# Patient Record
Sex: Male | Born: 1970 | Hispanic: No | Marital: Married | Smoking: Former smoker
Health system: Southern US, Community
[De-identification: ages and names within clinical notes are randomized; demographics above are authoritative.]

---

## 2012-02-07 ENCOUNTER — Other Ambulatory Visit: Payer: Self-pay | Admitting: Infectious Diseases

## 2012-02-07 ENCOUNTER — Ambulatory Visit
Admission: RE | Admit: 2012-02-07 | Discharge: 2012-02-07 | Disposition: A | Discharge status: Home / Self Care | Payer: No Typology Code available for payment source | Source: Ambulatory Visit | Attending: Infectious Diseases | Admitting: Infectious Diseases

## 2012-02-07 DIAGNOSIS — R7611 Nonspecific reaction to tuberculin skin test without active tuberculosis: Secondary | ICD-10-CM

## 2019-05-13 ENCOUNTER — Inpatient Hospital Stay (HOSPITAL_COMMUNITY)
Admission: EM | Admit: 2019-05-13 | Discharge: 2019-05-15 | DRG: 868 | Disposition: A | Discharge status: Home / Self Care | Payer: Medicaid Other | Attending: Internal Medicine | Admitting: Internal Medicine

## 2019-05-13 ENCOUNTER — Encounter (HOSPITAL_COMMUNITY): Payer: Self-pay | Admitting: Emergency Medicine

## 2019-05-13 ENCOUNTER — Emergency Department (HOSPITAL_COMMUNITY): Payer: Medicaid Other

## 2019-05-13 ENCOUNTER — Other Ambulatory Visit: Payer: Self-pay

## 2019-05-13 DIAGNOSIS — D696 Thrombocytopenia, unspecified: Secondary | ICD-10-CM | POA: Diagnosis present

## 2019-05-13 DIAGNOSIS — E871 Hypo-osmolality and hyponatremia: Secondary | ICD-10-CM | POA: Diagnosis present

## 2019-05-13 DIAGNOSIS — Z20828 Contact with and (suspected) exposure to other viral communicable diseases: Secondary | ICD-10-CM | POA: Diagnosis present

## 2019-05-13 DIAGNOSIS — D75A Glucose-6-phosphate dehydrogenase (G6PD) deficiency without anemia: Secondary | ICD-10-CM | POA: Diagnosis present

## 2019-05-13 DIAGNOSIS — G4700 Insomnia, unspecified: Secondary | ICD-10-CM | POA: Diagnosis present

## 2019-05-13 DIAGNOSIS — B54 Unspecified malaria: Principal | ICD-10-CM | POA: Diagnosis present

## 2019-05-13 DIAGNOSIS — Z23 Encounter for immunization: Secondary | ICD-10-CM

## 2019-05-13 DIAGNOSIS — D649 Anemia, unspecified: Secondary | ICD-10-CM | POA: Diagnosis present

## 2019-05-13 DIAGNOSIS — R509 Fever, unspecified: Secondary | ICD-10-CM

## 2019-05-13 DIAGNOSIS — Z789 Other specified health status: Secondary | ICD-10-CM

## 2019-05-13 LAB — URINALYSIS, ROUTINE W REFLEX MICROSCOPIC
Glucose, UA: NEGATIVE mg/dL
Hgb urine dipstick: NEGATIVE
Ketones, ur: NEGATIVE mg/dL
Leukocytes,Ua: NEGATIVE
Nitrite: NEGATIVE
Protein, ur: 30 mg/dL — AB
Specific Gravity, Urine: 1.015 (ref 1.005–1.030)
pH: 6 (ref 5.0–8.0)

## 2019-05-13 LAB — CBC WITH DIFFERENTIAL/PLATELET
Abs Immature Granulocytes: 0.14 10*3/uL — ABNORMAL HIGH (ref 0.00–0.07)
Basophils Absolute: 0 10*3/uL (ref 0.0–0.1)
Basophils Relative: 1 %
Eosinophils Absolute: 0 10*3/uL (ref 0.0–0.5)
Eosinophils Relative: 0 %
HCT: 37.7 % — ABNORMAL LOW (ref 39.0–52.0)
Hemoglobin: 12.7 g/dL — ABNORMAL LOW (ref 13.0–17.0)
Immature Granulocytes: 3 %
Lymphocytes Relative: 34 %
Lymphs Abs: 1.8 10*3/uL (ref 0.7–4.0)
MCH: 26 pg (ref 26.0–34.0)
MCHC: 33.7 g/dL (ref 30.0–36.0)
MCV: 77.1 fL — ABNORMAL LOW (ref 80.0–100.0)
Monocytes Absolute: 0.6 10*3/uL (ref 0.1–1.0)
Monocytes Relative: 10 %
Neutro Abs: 2.8 10*3/uL (ref 1.7–7.7)
Neutrophils Relative %: 52 %
Platelets: 67 10*3/uL — ABNORMAL LOW (ref 150–400)
RBC: 4.89 MIL/uL (ref 4.22–5.81)
RDW: 15 % (ref 11.5–15.5)
WBC: 5.4 10*3/uL (ref 4.0–10.5)
nRBC: 0 % (ref 0.0–0.2)

## 2019-05-13 LAB — DIC (DISSEMINATED INTRAVASCULAR COAGULATION)PANEL
D-Dimer, Quant: 1.19 ug/mL-FEU — ABNORMAL HIGH (ref 0.00–0.50)
Fibrinogen: 672 mg/dL — ABNORMAL HIGH (ref 210–475)
INR: 1.2 (ref 0.8–1.2)
Platelets: 57 10*3/uL — ABNORMAL LOW (ref 150–400)
Prothrombin Time: 14.6 seconds (ref 11.4–15.2)
Smear Review: NONE SEEN
aPTT: 36 seconds (ref 24–36)

## 2019-05-13 LAB — COMPREHENSIVE METABOLIC PANEL
ALT: 108 U/L — ABNORMAL HIGH (ref 0–44)
AST: 95 U/L — ABNORMAL HIGH (ref 15–41)
Albumin: 2.1 g/dL — ABNORMAL LOW (ref 3.5–5.0)
Alkaline Phosphatase: 96 U/L (ref 38–126)
Anion gap: 10 (ref 5–15)
BUN: 15 mg/dL (ref 6–20)
CO2: 22 mmol/L (ref 22–32)
Calcium: 7.9 mg/dL — ABNORMAL LOW (ref 8.9–10.3)
Chloride: 93 mmol/L — ABNORMAL LOW (ref 98–111)
Creatinine, Ser: 1.04 mg/dL (ref 0.61–1.24)
GFR calc Af Amer: >60 mL/min (ref >60)
GFR calc non Af Amer: >60 mL/min (ref >60)
Glucose, Bld: 139 mg/dL — ABNORMAL HIGH (ref 70–99)
Potassium: 4.1 mmol/L (ref 3.5–5.1)
Sodium: 125 mmol/L — ABNORMAL LOW (ref 135–145)
Total Bilirubin: 1.1 mg/dL (ref 0.3–1.2)
Total Protein: 7.1 g/dL (ref 6.5–8.1)

## 2019-05-13 LAB — URINALYSIS, MICROSCOPIC (REFLEX)

## 2019-05-13 LAB — SAVE SMEAR(SSMR), FOR PROVIDER SLIDE REVIEW

## 2019-05-13 LAB — PARASITE EXAM SCREEN, BLOOD-W CONF TO LABCORP (NOT @ ARMC)

## 2019-05-13 LAB — SARS CORONAVIRUS 2 (TAT 6-24 HRS): SARS Coronavirus 2: NEGATIVE

## 2019-05-13 LAB — HIV ANTIBODY (ROUTINE TESTING W REFLEX): HIV Screen 4th Generation wRfx: NONREACTIVE

## 2019-05-13 MED ORDER — RAMELTEON 8 MG PO TABS
8.0000 mg | ORAL_TABLET | Freq: Every day | ORAL | Status: DC
  Administered 2019-05-13 – 2019-05-14 (×2): 8 mg via ORAL
  Filled 2019-05-13 (×3): qty 1

## 2019-05-13 MED ORDER — ACETAMINOPHEN 500 MG PO TABS
1000.0000 mg | ORAL_TABLET | Freq: Once | ORAL | Status: AC
  Administered 2019-05-13: 1000 mg via ORAL
  Filled 2019-05-13: qty 2

## 2019-05-13 MED ORDER — ONDANSETRON HCL 4 MG/2ML IJ SOLN: 4.0000 mg | Freq: Four times a day (QID) | INTRAMUSCULAR | Status: DC | PRN

## 2019-05-13 MED ORDER — ARTEMETHER-LUMEFANTRINE 20-120 MG PO TABS
4.0000 | ORAL_TABLET | Freq: Once | ORAL | Status: DC
  Filled 2019-05-13: qty 4

## 2019-05-13 MED ORDER — ARTEMETHER-LUMEFANTRINE 20-120 MG PO TABS
4.0000 | ORAL_TABLET | Freq: Three times a day (TID) | ORAL | Status: AC
  Administered 2019-05-13 (×2): 4 via ORAL
  Filled 2019-05-13 (×2): qty 4

## 2019-05-13 MED ORDER — MORPHINE SULFATE (PF) 4 MG/ML IV SOLN
4.0000 mg | Freq: Once | INTRAVENOUS | Status: AC
  Administered 2019-05-13: 4 mg via INTRAVENOUS
  Filled 2019-05-13: qty 1

## 2019-05-13 MED ORDER — ACETAMINOPHEN 325 MG PO TABS: 650.0000 mg | ORAL_TABLET | Freq: Four times a day (QID) | ORAL | Status: DC | PRN

## 2019-05-13 MED ORDER — SODIUM CHLORIDE 0.9 % IV BOLUS
1000.0000 mL | Freq: Once | INTRAVENOUS | Status: AC
  Administered 2019-05-13: 11:00:00 1000 mL via INTRAVENOUS

## 2019-05-13 MED ORDER — ACETAMINOPHEN 325 MG PO TABS
650.0000 mg | ORAL_TABLET | Freq: Four times a day (QID) | ORAL | Status: DC
  Administered 2019-05-13 – 2019-05-14 (×4): 650 mg via ORAL
  Filled 2019-05-13 (×4): qty 2

## 2019-05-13 MED ORDER — IBUPROFEN 800 MG PO TABS
800.0000 mg | ORAL_TABLET | Freq: Once | ORAL | Status: AC
  Administered 2019-05-13: 800 mg via ORAL
  Filled 2019-05-13: qty 1

## 2019-05-13 MED ORDER — ARTEMETHER-LUMEFANTRINE 20-120 MG PO TABS
4.0000 | ORAL_TABLET | Freq: Two times a day (BID) | ORAL | Status: DC
  Administered 2019-05-14 – 2019-05-15 (×3): 4 via ORAL
  Filled 2019-05-13 (×4): qty 4

## 2019-05-13 MED ORDER — ONDANSETRON HCL 4 MG/2ML IJ SOLN
4.0000 mg | Freq: Once | INTRAMUSCULAR | Status: AC
  Administered 2019-05-13: 4 mg via INTRAVENOUS
  Filled 2019-05-13: qty 2

## 2019-05-13 MED ORDER — ACETAMINOPHEN 325 MG PO TABS
650.0000 mg | ORAL_TABLET | Freq: Once | ORAL | Status: AC
  Administered 2019-05-13: 05:00:00 650 mg via ORAL
  Filled 2019-05-13: qty 2

## 2019-05-13 NOTE — ED Provider Notes (Signed)
[facility] EMERGENCY DEPARTMENT Provider Note   CSN: [id number] Arrival date & time: 05/13/19  0359     History   Chief Complaint Chief Complaint  Patient presents with  . Insomnia    HPI Mason Dunn is a 48 y.o. male.     Pt presents to the ED today with back pain and fever.  He has not been able to sleep.  Pt has just arrived from [place] to the [place] on 10[place]19.  The pt flew.  He was not aware he had a fever.  He was given tylenol at 0500.  Pt denies any n/v.  Pt speaks the [place] dialect of Arabic.  Information obtained via interpreter.     History reviewed. No pertinent past medical history.  Patient Active Problem List   Diagnosis Date Noted  . 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 No pertinent surgical history.   No surg hx   Home Medications    Prior to Admission medications   Medication Sig Start Date End Date Taking? Authorizing Provider  doxylamine, Sleep, (UNISOM) 25 MG tablet Take 25 mg by mouth at bedtime as needed for sleep.   Yes [provider]  Multiple Vitamin (MULTIVITAMIN WITH MINERALS) TABS tablet Take 1 tablet by mouth daily.   Yes [provider]  no meds  Family History No family history on file.  Social History Social History   Tobacco Use  . Smoking status: Never Smoker  . Smokeless tobacco: Never Used  Substance Use Topics  . Alcohol use: Never    Frequency: Never  . Drug use: Never     Allergies   Patient has no known allergies.   Review of Systems Review of Systems  Constitutional: Positive for fever.  Musculoskeletal: Positive for back pain.  All other systems reviewed and are negative.    Physical Exam Updated Vital Signs BP 105/80 (BP Location: Right Arm)   Pulse 87   Temp 98.7 F (37.1 C) (Oral)   Resp (!) 26   Ht 5\' 8"  (1.727 m)   Wt 90.7 kg   SpO2 95%   BMI 30.41 kg/m   Physical Exam Vitals signs and nursing note reviewed.   Constitutional:      Appearance: Normal appearance.  HENT:     Head: Normocephalic and atraumatic.     Right Ear: External ear normal.     Left Ear: External ear normal.     Nose: Nose normal.     Mouth/Throat:     Mouth: Mucous membranes are dry.  Eyes:     Extraocular Movements: Extraocular movements intact.     Conjunctiva/sclera: Conjunctivae normal.     Pupils: Pupils are equal, round, and reactive to light.  Neck:     Musculoskeletal: Normal range of motion and neck supple.  Cardiovascular:     Rate and Rhythm: Normal rate and regular rhythm.     Pulses: Normal pulses.     Heart sounds: Normal heart sounds.  Pulmonary:     Effort: Pulmonary effort is normal.     Breath sounds: Normal breath sounds.  Abdominal:     General: Abdomen is flat. Bowel sounds are normal.     Palpations: Abdomen is soft.  Musculoskeletal: Normal range of motion.  Skin:    General: Skin is warm.     Capillary Refill: Capillary refill takes less than 2 seconds.  Neurological:     General: No focal deficit present.  Mental Status: He is alert and oriented to person, place, and time.  Psychiatric:        Mood and Affect: Mood normal.        Behavior: Behavior normal.        Thought Content: Thought content normal.        Judgment: Judgment normal.      ED Treatments / Results  Labs (all labs ordered are listed, but only abnormal results are displayed) Labs Reviewed  CBC WITH DIFFERENTIAL/PLATELET - Abnormal; Notable for the following components:      Result Value   Hemoglobin 12.7 (*)    HCT 37.7 (*)    MCV 77.1 (*)    Platelets 67 (*)    Abs Immature Granulocytes 0.14 (*)    All other components within normal limits  COMPREHENSIVE METABOLIC PANEL - Abnormal; Notable for the following components:   Sodium 125 (*)    Chloride 93 (*)    Glucose, Bld 139 (*)    Calcium 7.9 (*)    Albumin 2.1 (*)    AST 95 (*)    ALT 108 (*)    All other components within normal limits   URINALYSIS, ROUTINE W REFLEX MICROSCOPIC - Abnormal; Notable for the following components:   Bilirubin Urine MODERATE (*)    Protein, ur 30 (*)    All other components within normal limits  URINALYSIS, MICROSCOPIC (REFLEX) - Abnormal; Notable for the following components:   Bacteria, UA FEW (*)    All other components within normal limits  SARS CORONAVIRUS 2 (TAT 6-24 HRS)  PARASITE EXAM SCREEN, BLOOD-W CONF TO [facility] (NOT @ [facility])  PARASITE EXAM, BLOOD  HIV ANTIBODY (ROUTINE TESTING W REFLEX)    EKG EKG Interpretation  Date/Time:  Tuesday May 13 2019 09:41:44 EDT Ventricular Rate:  94 PR Interval:    QRS Duration: 92 QT Interval:  340 QTC Calculation: 426 R Axis:   54 Text Interpretation: Sinus rhythm Nonspecific repol abnormality, inferior leads Borderline ST elevation, lateral leads No old tracing to compare Confirmed by Jacalyn LefevreHaviland, Yailine Ballard [phone]([id number]) on 05/13/2019 9:45:28 AM   Radiology Dg Chest Portable 1 View  Result Date: 05/13/2019 CLINICAL DATA:  Lack of sleep for 1 week, insomnia. EXAM: PORTABLE CHEST 1 VIEW COMPARISON:  02/07/2012 FINDINGS: The heart size and mediastinal contours are within normal limits. Both lungs are clear. The visualized skeletal structures are unremarkable. IMPRESSION: No active disease. Electronically Signed   By: Donzetta KohutGeoffrey  Wile M.D.   On: 05/13/2019 10:14    Procedures Procedures (including critical care time)  Medications Ordered in ED Medications  artemether-lumefantrine (COARTEM) 20-120 MG tablet 4 tablet (has no administration in time range)  artemether-lumefantrine (COARTEM) 20-120 MG tablet 4 tablet (has no administration in time range)    Followed by  artemether-lumefantrine (COARTEM) 20-120 MG tablet 4 tablet (has no administration in time range)  acetaminophen (TYLENOL) tablet 650 mg (650 mg Oral Given 05/13/19 0512)  sodium chloride 0.9 % bolus 1,000 mL (1,000 mLs Intravenous New Bag/Given 05/13/19 1032)  morphine 4 MG/ML  injection 4 mg (4 mg Intravenous Given 05/13/19 1037)  ondansetron (ZOFRAN) injection 4 mg (4 mg Intravenous Given 05/13/19 1035)  acetaminophen (TYLENOL) tablet 1,000 mg (1,000 mg Oral Given 05/13/19 1032)  ibuprofen (ADVIL) tablet 800 mg (800 mg Oral Given 05/13/19 1033)     Initial Impression / Assessment and Plan / ED Course  I have reviewed the triage vital signs and the nursing notes.  Pertinent labs & imaging results that  were available during my care of the patient were reviewed by me and considered in my medical decision making (see chart for details).  Clinical Course as of May 12 1049  Tue May 13, 2019  0923 NEUT#: 2.8 [JR]    Clinical Course User Index [JR] Robinson, [place] N, PA-C   Pt looks to have Plasmodium on thin smear.  I asked him if he's ever had Malaria and he said he has had it in the past.  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  Pt d/w IMTS for admission.  Final Clinical Impressions(s) / ED Diagnoses   Final diagnoses:  Hyponatremia  Malaria  Anemia, unspecified type  Fever in adult  Language barrier affecting health care    ED Discharge Orders    None       Isla Pence, MD 05/13/19 1050

## 2019-05-13 NOTE — H&P (Signed)
Date: 05/13/2019               Patient Name:  Mason Dunn MRN: [id number]  DOB: 03-28-1971 Age / Sex: 48 y.o., male   PCP: Patient, No Pcp Per         Medical Service: Internal Medicine Teaching Service         Attending Physician: Dr. Aldine Contes, MD    First Contact: Dr. Gilford Rile Pager: [phone]  Second Contact: Dr. Myrtie Hawk Pager: [phone]       After Hours (After 5p/  First Contact Pager: [phone]  weekends / holidays): Second Contact Pager: [phone]   Chief Complaint: "insomnia and fever"  History of Present Illness: Mason Dunn is a 48 yo male with an unremarkable PMHx who presented to the [facility] ED for insomnia. He is unable to explain why he has trouble sleeping. He states that he has issues with falling and staying asleep. His insomnia has been an issue for the past three days now. He endorses mild associated fevers, persistent headache, and dark urine.  He denies chills, myalgias, chest pain, abdominal pain, visual changes, cough, dyspnea, diarrhea, constipation or general malaise.  Patient denies a prior history of malaria or treatment for such in the past. He endorsed arriving from the [place] on October 19th, 2020 from the city of [place].   Meds:  Current Meds  Medication Sig  . doxylamine, Sleep, (UNISOM) 25 MG tablet Take 25 mg by mouth at bedtime as needed for sleep.  . Multiple Vitamin (MULTIVITAMIN WITH MINERALS) TABS tablet Take 1 tablet by mouth daily.   Allergies: Allergies as of 05/13/2019  . (No Known Allergies)   History reviewed. No pertinent past medical history.  Family History:  Denied known medical history  Social History:  Tobacco use-30 pyh EtOH-denied Denied illicit drug use  Review of Systems: A complete ROS was negative except as per HPI.   Physical Exam: Blood pressure 106/87, pulse 81, temperature 98.7 F (37.1 C), temperature source Oral, resp. rate (!) 23, height 5\' 8"  (1.727 m), weight 90.7 kg, SpO2 100 %. Physical  Exam Vitals signs reviewed.  Constitutional:      General: He is not in acute distress.    Appearance: He is well-developed. He is diaphoretic. He is not ill-appearing.  HENT:     Head: Normocephalic and atraumatic.  Eyes:     Conjunctiva/sclera: Conjunctivae normal.  Neck:     Musculoskeletal: Normal range of motion.  Cardiovascular:     Rate and Rhythm: Normal rate and regular rhythm.     Heart sounds: No murmur.  Pulmonary:     Effort: Pulmonary effort is normal. No respiratory distress.     Breath sounds: Normal breath sounds. No stridor.  Abdominal:     General: Bowel sounds are normal. There is no distension.     Palpations: Abdomen is soft.  Musculoskeletal:        General: No tenderness.     Right lower leg: No edema.     Left lower leg: No edema.  Skin:    General: Skin is warm.     Capillary Refill: Capillary refill takes less than 2 seconds.  Neurological:     General: No focal deficit present.     Mental Status: He is alert and oriented to person, place, and time.  Psychiatric:        Mood and Affect: Mood normal.        Thought Content: Thought content normal.  EKG: personally reviewed my interpretation is NSR  CXR: personally reviewed my interpretation is no acute cardio pulmonary process  Assessment & Plan by Problem: Active Problems:   Plasmodium infection  Assessment: Mason Dunn is a 48 yo male with an unremarkable PMHx who presented to the [facility] ED for insomnia found to have blood parasites on CBC absent a leukocytosis. This was felt to be plasmodium on smear. The smear was personally viewed by our team to which a few intracellular (RBC) signet ring structures can be found distributed throughout the slide which are indeed indicative of plasmodium. Patients parasite exam was sent to [facility] for speciation. He did not present with signs of severe malaria such as encephalopathy, convulsions, acidosis, hypoglycemia, severe anemia, and renal  impairment, jaundice, pulmonary edema, significant bleeding, shock or hyperparasitemia.  Patient's LFTs are mildly elevated and 95 and 108, albumin is 2.1, total bili is 1.1, sodium is 125, serum creatinine is 1.04.  Patient has a mild thrombocytopenia at 57, elevated fibrinogen and D-dimer.  The aforementioned labs are not consistent with severe malaria but warrant prompt treatment.  Given his he is from [place] where chloroquine resistance is high treatment with a artemether combination is recommended. I reviewed the WHO malaria guidelines to further explore the recommended treatment of non-severe malaria in a patient from [place]. The guidelines concur.   Plan: Plasmodium infection: Plasmodium noted on thin prep and confirmed by MD team. We will initiate treatment with Coartem while awaiting speciation due to the moderate severity of his infection. As noted above the patient is mildly anemic and has multiple additional laboratory abnormalities not consistent with severe infection. Treat fever aggressively with tylenol to prevent increasing hemolysis.  --Coartem 20-120mg  4 tablets at time zero, repeat 4 tablets at eight hours then 4 tablets BID for two additional days to complete treatment --LDH ordered --Smear ordered --Zofran 10m q6 hours PRN --Repeat CBC and CMP in am --Placed in med-surg observation, may need inpatient status --DIC panel fibrinogen and D-dimer elevated consistent with moderate malaria --F/up parasite speciation  --Will schedule tylenol 650mg  q6 hours  Insomnia: Etiology uncertain. Appears more chronic that the past three days. I suspect it is 2/2 to his general illness but was unable to better evaluate the insomnia given his lack of explanation of defining characteristics. Difficult to determine what type of insomnia he is experiencing.  --Will start with Ramelteon 8mg  QHS  Diet: Regular Code: Full DVT PPX: Held due to thrombocytopenia  Dispo: Admit patient to Observation with  expected length of stay less than 2 midnights.  Signed: , MD 05/13/2019, 1:26 PM  Pager: # [phone]

## 2019-05-13 NOTE — ED Notes (Signed)
ED TO INPATIENT HANDOFF REPORT  ED Nurse Name and Phone #: [phone] [facility]   S Name/Age/Gender Mason Dunn 48 y.o. male Room/Bed: 058C/058C  Code Status   Code Status: Full Code  Home/SNF/Other Home Patient oriented to: self, place, time and situation Is this baseline? Yes   Triage Complete: Triage complete  Chief Complaint headace  Triage Note Patient reports insomnia for 1 week , denies hallucinations or headache , febrile at triage.    Allergies No Known Allergies  Level of Care/Admitting Diagnosis ED Disposition    ED Disposition Condition Comment   Admit  Hospital Area: [facility] [[id number]]  Level of Care: Med-Surg [16]  Covid Evaluation: Asymptomatic Screening Protocol (No Symptoms)  Diagnosis: Plasmodium infection [200637]  Admitting Physician: Earl Lagos [[id number]]  Attending Physician: Earl Lagos [[id number]]  PT Class (Do Not Modify): Observation [104]  PT Acc Code (Do Not Modify): Observation [10022]       B Medical/Surgery History History reviewed. No pertinent past medical history. History reviewed. No pertinent surgical history.   A IV Location/Drains/Wounds Patient Lines/Drains/Airways Status   Active Line/Drains/Airways    Name:   Placement date:   Placement time:   Site:   Days:   Peripheral IV 05/13/19 Right Hand   05/13/19    1030    Hand   less than 1          Intake/Output Last 24 hours  Intake/Output Summary (Last 24 hours) at 05/13/2019 2307 Last data filed at 05/13/2019 2108 Gross per 24 hour  Intake 1000 ml  Output 825 ml  Net 175 ml    Labs/Imaging Results for orders placed or performed during the hospital encounter of 05/13/19 (from the past 48 hour(s))  Urinalysis, Routine w reflex microscopic     Status: Abnormal   Collection Time: 05/13/19  5:09 AM  Result Value Ref Range   Color, Urine YELLOW YELLOW   APPearance CLEAR CLEAR   Specific Gravity, Urine 1.015 1.005 - 1.030    pH 6.0 5.0 - 8.0   Glucose, UA NEGATIVE NEGATIVE mg/dL   Hgb urine dipstick NEGATIVE NEGATIVE   Bilirubin Urine MODERATE (A) NEGATIVE   Ketones, ur NEGATIVE NEGATIVE mg/dL   Protein, ur 30 (A) NEGATIVE mg/dL   Nitrite NEGATIVE NEGATIVE   Leukocytes,Ua NEGATIVE NEGATIVE    Comment: Performed at [facility], [address] [place], [place] [address]  Urinalysis, Microscopic (reflex)     Status: Abnormal   Collection Time: 05/13/19  5:09 AM  Result Value Ref Range   RBC / HPF 0-5 0 - 5 RBC/hpf   WBC, UA 0-5 0 - 5 WBC/hpf   Bacteria, UA FEW (A) NONE SEEN   Squamous Epithelial / LPF 0-5 0 - 5   Granular Casts, UA PRESENT     Comment: Performed at [facility], [address] [place], [place] [address]  CBC with Differential     Status: Abnormal   Collection Time: 05/13/19  5:19 AM  Result Value Ref Range   WBC 5.4 4.0 - 10.5 K/uL   RBC 4.89 4.22 - 5.81 MIL/uL   Hemoglobin 12.7 (L) 13.0 - 17.0 g/dL   HCT 69.6 (L) 29.5 - 28.4 %   MCV 77.1 (L) 80.0 - 100.0 fL   MCH 26.0 26.0 - 34.0 pg   MCHC 33.7 30.0 - 36.0 g/dL   RDW 13.2 44.0 - 10.2 %   Platelets 67 (L) 150 - 400 K/uL    Comment:  REPEATED TO VERIFY PLATELET COUNT CONFIRMED BY SMEAR Immature Platelet Fraction may be clinically indicated, consider ordering this additional test [id number]    nRBC 0.0 0.0 - 0.2 %   Neutrophils Relative % 52 %   Neutro Abs 2.8 1.7 - 7.7 K/uL   Lymphocytes Relative 34 %   Lymphs Abs 1.8 0.7 - 4.0 K/uL   Monocytes Relative 10 %   Monocytes Absolute 0.6 0.1 - 1.0 K/uL   Eosinophils Relative 0 %   Eosinophils Absolute 0.0 0.0 - 0.5 K/uL   Basophils Relative 1 %   Basophils Absolute 0.0 0.0 - 0.1 K/uL   RBC Morphology      BLOOD PARASITE NOTED CALLED TO KAREN COBB RN 0703 10 27 2020 BY MACEDA J   Immature Granulocytes 3 %   Abs Immature Granulocytes 0.14 (H) 0.00 - 0.07 K/uL    Comment: Performed at [facility], [address], [place], [place] [address]  Comprehensive  metabolic panel     Status: Abnormal   Collection Time: 05/13/19  5:19 AM  Result Value Ref Range   Sodium 125 (L) 135 - 145 mmol/L   Potassium 4.1 3.5 - 5.1 mmol/L   Chloride 93 (L) 98 - 111 mmol/L   CO2 22 22 - 32 mmol/L   Glucose, Bld 139 (H) 70 - 99 mg/dL   BUN 15 6 - 20 mg/dL   Creatinine, Ser 8.18 0.61 - 1.24 mg/dL   Calcium 7.9 (L) 8.9 - 10.3 mg/dL   Total Protein 7.1 6.5 - 8.1 g/dL   Albumin 2.1 (L) 3.5 - 5.0 g/dL   AST 95 (H) 15 - 41 U/L   ALT 108 (H) 0 - 44 U/L   Alkaline Phosphatase 96 38 - 126 U/L   Total Bilirubin 1.1 0.3 - 1.2 mg/dL   GFR calc non Af Amer >60 >60 mL/min   GFR calc Af Amer >60 >60 mL/min   Anion gap 10 5 - 15    Comment: Performed at [facility], [address] [place], [place] [address]  Parasite Exam Screen, Blood-w conf to [facility] (Not @ [facility])     Status: None   Collection Time: 05/13/19  7:24 AM  Result Value Ref Range   Parasite Exam Screen, Blood            Comment: Plasmodium or other Blood Parasites seen on thin smears. Sent to Reference Laboratory for identification and speciation. CRITICAL RESULT CALLED TO, READ BACK BY AND VERIFIED WITH: SCOTT [facility] 05/13/2019 9702 [facility] Performed at [facility], [address] [place], [place] [address]   HIV Antibody (routine testing w rflx)     Status: None   Collection Time: 05/13/19  9:59 AM  Result Value Ref Range   HIV Screen 4th Generation wRfx NON REACTIVE NON REACTIVE    Comment: Performed at [facility], [address] [place], [place] [address]  SARS CORONAVIRUS 2 (TAT 6-24 HRS) Nasopharyngeal Nasopharyngeal Swab     Status: None   Collection Time: 05/13/19 10:16 AM   Specimen: Nasopharyngeal Swab  Result Value Ref Range   SARS Coronavirus 2 NEGATIVE NEGATIVE    Comment: (NOTE) SARS-CoV-2 target nucleic acids are NOT DETECTED. The SARS-CoV-2 RNA is generally detectable in upper and lower respiratory specimens during the acute phase of infection.  Negative results do not preclude SARS-CoV-2 infection, do not rule out co-infections with other pathogens, and should not be used as the sole basis for  treatment or other patient management decisions. Negative results must be combined with clinical observations, patient history, and epidemiological information. The expected result is Negative. Fact Sheet for Patients: SugarRoll.be Fact Sheet for Healthcare Providers: https://www.woods-mathews.com/ This test is not yet approved or cleared by the [place] FDA and  has been authorized for detection and/or diagnosis of SARS-CoV-2 by FDA under an Emergency Use Authorization (EUA). This EUA will remain  in effect (meaning this test can be used) for the duration of the COVID-19 declaration under Section 56 4(b)(1) of the Act, 21 U.S.C. section 360bbb-3(b)(1), unless the authorization is terminated or revoked sooner. Performed at [facility], [place] [address] [place], [place] [address]   DIC (disseminated intravasc coag) panel     Status: Abnormal   Collection Time: 05/13/19  1:14 PM  Result Value Ref Range   Prothrombin Time 14.6 11.4 - 15.2 seconds   INR 1.2 0.8 - 1.2    Comment: (NOTE) INR goal varies based on device and disease states.    aPTT 36 24 - 36 seconds   Fibrinogen 672 (H) 210 - 475 mg/dL   D-Dimer, Quant 1.19 (H) 0.00 - 0.50 ug/mL-FEU    Comment: (NOTE) At the manufacturer cut-off of 0.50 ug/mL FEU, this assay has been documented to exclude PE with a sensitivity and negative predictive value of 97 to 99%.  At this time, this assay has not been approved by the FDA to exclude DVT/VTE. Results should be correlated with clinical presentation.    Platelets 57 (L) 150 - 400 K/uL    Comment: REPEATED TO VERIFY Immature Platelet Fraction may be clinically indicated, consider ordering this additional test [id number] CONSISTENT WITH PREVIOUS RESULT    Smear Review NO  SCHISTOCYTES SEEN     Comment: Performed at [facility], [place] [address] [place], [place] [address]  Save Smear     Status: None   Collection Time: 05/13/19  1:14 PM  Result Value Ref Range   Smear Review SMEAR STAINED AND AVAILABLE FOR REVIEW     Comment: Performed at [place] [address] [place],  [address]   Dg Chest Portable 1 View  Result Date: 05/13/2019 CLINICAL DATA:  Lack of sleep for 1 week, insomnia. EXAM: PORTABLE CHEST 1 VIEW COMPARISON:  02/07/2012 FINDINGS: The heart size and mediastinal contours are within normal limits. Both lungs are clear. The visualized skeletal structures are unremarkable. IMPRESSION: No active disease. Electronically Signed   By: Zetta Bills M.D.   On: 05/13/2019 10:14    Pending Labs Unresulted Labs (From admission, onward)    Start     Ordered   05/14/19 0500  Comprehensive metabolic panel  Tomorrow morning,   R     05/13/19 1156   05/14/19 0500  CBC  Tomorrow morning,   R     05/13/19 1156   05/13/19 1402  Lactate dehydrogenase  Add-on,   AD     05/13/19 1401   05/13/19 0724  Parasite Exam, Blood  Once,   STAT     05/13/19 0724          Vitals/Pain Today's Vitals   05/13/19 2103 05/13/19 2108 05/13/19 2200 05/13/19 2206  BP: 104/74  115/82   Pulse:   82   Resp: (!) 25  (!) 21   Temp:      TempSrc:      SpO2:   97%   Weight:      Height:  PainSc:  0-No pain  Asleep    Isolation Precautions No active isolations  Medications Medications  artemether-lumefantrine (COARTEM) 20-120 MG tablet 4 tablet (4 tablets Oral Given 05/13/19 2104)    Followed by  artemether-lumefantrine (COARTEM) 20-120 MG tablet 4 tablet (has no administration in time range)  ondansetron (ZOFRAN) injection 4 mg (has no administration in time range)  ramelteon (ROZEREM) tablet 8 mg (8 mg Oral Given 05/13/19 2205)  acetaminophen (TYLENOL) tablet 650 mg (650 mg Oral Given 05/13/19 2205)  acetaminophen (TYLENOL) tablet 650  mg (650 mg Oral Given 05/13/19 0512)  sodium chloride 0.9 % bolus 1,000 mL (0 mLs Intravenous Stopped 05/13/19 1303)  morphine 4 MG/ML injection 4 mg (4 mg Intravenous Given 05/13/19 1037)  ondansetron (ZOFRAN) injection 4 mg (4 mg Intravenous Given 05/13/19 1035)  acetaminophen (TYLENOL) tablet 1,000 mg (1,000 mg Oral Given 05/13/19 1032)  ibuprofen (ADVIL) tablet 800 mg (800 mg Oral Given 05/13/19 1033)    Mobility walks Low fall risk   Focused Assessments Cardiac Assessment Handoff:    No results found for: CKTOTAL, CKMB, CKMBINDEX, TROPONINI Lab Results  Component Value Date   DDIMER 1.19 (H) 05/13/2019   Does the Patient currently have chest pain? No     R Recommendations: See Admitting Provider Note  Report given to:   Additional Notes:  A&Ox 4; denies pain at this time

## 2019-05-13 NOTE — ED Triage Notes (Signed)
Patient reports insomnia for 1 week , denies hallucinations or headache , febrile at triage.

## 2019-05-14 ENCOUNTER — Encounter (HOSPITAL_COMMUNITY): Payer: Self-pay

## 2019-05-14 DIAGNOSIS — B530 Plasmodium ovale malaria: Secondary | ICD-10-CM | POA: Diagnosis not present

## 2019-05-14 DIAGNOSIS — Z23 Encounter for immunization: Secondary | ICD-10-CM | POA: Diagnosis not present

## 2019-05-14 DIAGNOSIS — E871 Hypo-osmolality and hyponatremia: Secondary | ICD-10-CM | POA: Diagnosis present

## 2019-05-14 DIAGNOSIS — B538 Other malaria, not elsewhere classified: Secondary | ICD-10-CM | POA: Diagnosis not present

## 2019-05-14 DIAGNOSIS — R509 Fever, unspecified: Secondary | ICD-10-CM | POA: Diagnosis present

## 2019-05-14 DIAGNOSIS — Z20828 Contact with and (suspected) exposure to other viral communicable diseases: Secondary | ICD-10-CM | POA: Diagnosis present

## 2019-05-14 DIAGNOSIS — D75A Glucose-6-phosphate dehydrogenase (G6PD) deficiency without anemia: Secondary | ICD-10-CM | POA: Diagnosis present

## 2019-05-14 DIAGNOSIS — G4700 Insomnia, unspecified: Secondary | ICD-10-CM | POA: Diagnosis present

## 2019-05-14 DIAGNOSIS — D649 Anemia, unspecified: Secondary | ICD-10-CM | POA: Diagnosis present

## 2019-05-14 DIAGNOSIS — D696 Thrombocytopenia, unspecified: Secondary | ICD-10-CM | POA: Diagnosis present

## 2019-05-14 DIAGNOSIS — B54 Unspecified malaria: Secondary | ICD-10-CM | POA: Diagnosis present

## 2019-05-14 LAB — CBC
HCT: 32.6 % — ABNORMAL LOW (ref 39.0–52.0)
Hemoglobin: 11 g/dL — ABNORMAL LOW (ref 13.0–17.0)
MCH: 25.8 pg — ABNORMAL LOW (ref 26.0–34.0)
MCHC: 33.7 g/dL (ref 30.0–36.0)
MCV: 76.5 fL — ABNORMAL LOW (ref 80.0–100.0)
Platelets: 73 10*3/uL — ABNORMAL LOW (ref 150–400)
RBC: 4.26 MIL/uL (ref 4.22–5.81)
RDW: 15 % (ref 11.5–15.5)
WBC: 5.1 10*3/uL (ref 4.0–10.5)
nRBC: 0 % (ref 0.0–0.2)

## 2019-05-14 LAB — COMPREHENSIVE METABOLIC PANEL
ALT: 69 U/L — ABNORMAL HIGH (ref 0–44)
AST: 36 U/L (ref 15–41)
Albumin: 1.8 g/dL — ABNORMAL LOW (ref 3.5–5.0)
Alkaline Phosphatase: 97 U/L (ref 38–126)
Anion gap: 7 (ref 5–15)
BUN: 20 mg/dL (ref 6–20)
CO2: 24 mmol/L (ref 22–32)
Calcium: 7.9 mg/dL — ABNORMAL LOW (ref 8.9–10.3)
Chloride: 100 mmol/L (ref 98–111)
Creatinine, Ser: 0.9 mg/dL (ref 0.61–1.24)
GFR calc Af Amer: >60 mL/min (ref >60)
GFR calc non Af Amer: >60 mL/min (ref >60)
Glucose, Bld: 109 mg/dL — ABNORMAL HIGH (ref 70–99)
Potassium: 3.7 mmol/L (ref 3.5–5.1)
Sodium: 131 mmol/L — ABNORMAL LOW (ref 135–145)
Total Bilirubin: 1.1 mg/dL (ref 0.3–1.2)
Total Protein: 6.2 g/dL — ABNORMAL LOW (ref 6.5–8.1)

## 2019-05-14 LAB — LACTATE DEHYDROGENASE: LDH: 381 U/L — ABNORMAL HIGH (ref 98–192)

## 2019-05-14 MED ORDER — INFLUENZA VAC SPLIT QUAD 0.5 ML IM SUSY
0.5000 mL | PREFILLED_SYRINGE | INTRAMUSCULAR | Status: AC
  Administered 2019-05-15: 0.5 mL via INTRAMUSCULAR
  Filled 2019-05-14 (×2): qty 0.5

## 2019-05-14 MED ORDER — ACETAMINOPHEN 325 MG PO TABS: 650.0000 mg | ORAL_TABLET | Freq: Four times a day (QID) | ORAL | Status: DC | PRN

## 2019-05-14 NOTE — Progress Notes (Signed)
  Date: 05/14/2019  Patient name: Denario Bagot  Medical record number: [id number]  Date of birth: 08-27-1970   I have seen and evaluated Norva Riffle and discussed their care with the Residency Team.  In brief: Patient is a 48 year old male with no significant past medical history presenting to the ED with insomnia as well as fevers.  Patient states that he has been unable to sleep at home over the last couple of days and has issues falling and staying asleep.  Patient also noted associated mild fevers, persistent headache and dark urine.  Patient recently traveled to [place] and returned on May 05, 2019.  No chest pain, no shortness of breath, no palpitations, lightheadedness, syncope, no focal weakness, tingling or numbness, no nausea vomiting, no abdominal pain, no diarrhea.  Today patient states that he is much improved and that he feels well.  PMHx, Fam Hx, and/or Soc Hx : As per resident note  Vitals:   05/14/19 0517 05/14/19 1303  BP: 96/72 107/76  Pulse: 83 80  Resp: 16 17  Temp: 98.6 F (37 C) 98.8 F (37.1 C)  SpO2: 98% 100%   General: Awake, alert oriented x3, NAD CVs: Regular rhythm, normal heart sounds Lungs: CTA laterally Abdomen: Soft, nontender, nondistended, normoactive bowel sounds Extremities: No edema noted, nontender to palpation Skin: Warm and dry HEENT: Normocephalic, atraumatic Psych: Normal mood and affect  Assessment and Plan: I have seen and evaluated the patient as outlined above. I agree with the formulated Assessment and Plan as detailed in the residents' note, with the following changes:   1.  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 -We will continue with scheduled Tylenol for now to prevent fever -Continue with Coartem 20/120 mg for now -We will check for G6PD deficiency.  If patient does not have G6PD  deficiency we will start patient on primaquine as well to cover Plasmodium ovale and Plavix -We will follow up parasite speciation -Patient's hemoglobin has decreased slightly from yesterday to 11.  Peripheral smear with no evidence of schistocytes.  We will continue to monitor closely. -No further work-up at this time  Aldine Contes, MD 10/28/20202:11 PM

## 2019-05-14 NOTE — Progress Notes (Addendum)
Subjective:  Patient was met at bedside this morning and interpreter was used for the interaction. He states he is feeling much better since yesterday. He has not had any fevers and he describes his urine as no longer dark. We informed the patient about his findings diagnostic of malaria and the plan for treatment. He confirmed that he has never been diagnosed or treated for malaria. He is tolerating his medicines well without new sx. He has no questions about the plan.   Objective:  Vital signs in last 24 hours: Vitals:   05/13/19 2300 05/13/19 2355 05/14/19 0013 05/14/19 0517  BP: 103/67  102/76 96/72  Pulse: 78  79 83  Resp: _0 Temp:   97.7 F (36.5 C) 98.6 F (37 C)  TempSrc:   Oral Oral  SpO2: 95%  100% 98%  Weight:  90.7 kg    Height:  5' 8" (1.727 m)     Physical Exam:  General: A&Ox3, no acute distress, well developed, well nourished.  CV: RRR, no m/r/g  Pulm: CTAB, breathing effort normal Abd: normal active bowel sounds, non tender, no rebound or guarding  Neuro: no focal neurological deficits Psych: mood normal, affect normal   CBC Latest Ref Rng & Units 05/14/2019 05/13/2019 05/13/2019  WBC 4.0 - 10.5 K/uL 5.1 - 5.4  Hemoglobin 13.0 - 17.0 g/dL 11.0(L) - 12.7(L)  Hematocrit 39.0 - 52.0 % 32.6(L) - 37.7(L)  Platelets 150 - 400 K/uL 73(L) 57(L) 67(L)    Ref Range & Units 02:43  LDH 98 - 192 U/L 381    Ref Range & Units 1d ago (05/13/19)  Prothrombin Time 11.4 - 15.2 seconds 14.6   INR 0.8 - 1.2 1.2    aPTT 24 - 36 seconds 36   Fibrinogen 210 - 475 mg/dL 672High    D-Dimer, Quant 0.00 - 0.50 ug/mL-FEU 1.19High    CMP Latest Ref Rng & Units 05/14/2019 05/13/2019  Glucose 70 - 99 mg/dL 109(H) 139(H)  BUN 6 - 20 mg/dL 20 15  Creatinine 0.61 - 1.24 mg/dL 0.90 1.04  Sodium 135 - 145 mmol/L 131(L) 125(L)  Potassium 3.5 - 5.1 mmol/L 3.7 4.1  Chloride 98 - 111 mmol/L 100 93(L)  CO2 22 - 32 mmol/L 24 22  Calcium 8.9 - 10.3 mg/dL 7.9(L) 7.9(L)  Total  Protein 6.5 - 8.1 g/dL 6.2(L) 7.1  Total Bilirubin 0.3 - 1.2 mg/dL 1.1 1.1  Alkaline Phos 38 - 126 U/L 97 96  AST 15 - 41 U/L 36 95(H)  ALT 0 - 44 U/L 69(H) 108(H)   Weight change: -0 kg  Intake/Output Summary (Last 24 hours) at 05/14/2019 1303 Last data filed at 05/13/2019 2108 Gross per 24 hour  Intake -  Output 825 ml  Net -825 ml    Assessment/Plan:  Mason Dunn is a 48 yo. M with no significant PMH who presents to the hospital for insomnia after returning from [place] 1 week ago and was found to have parasites characteristic of plasmodium on blood smear.   Active Problems:   Plasmodium infection Pathologist confirmation of parasite still pending but organisms seen on blood smear is characteristic of plasmodium. 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   His vital signs have been stable and he is experiencing a mild form of malaria. No longer febrile and can consider using Tylenol prn for fever (currently scheduled  q6h). Signs of severe malaria: hemodynamic instability, acidosis, renal impairment, mental status changes have not been present. Morning labs show mild decrease in Hgb (12.7->11.0) with elevated LDH (381) but no significant change in T Bili (1.1). This is suggestive of mild hemolysis due to organism and should improve with treatment. Sodium improved to 131 from 125 following 1L NS yesterday. Will continue to replenish fluid and electrolytes through PO diet. LFTs show improvement with decreases in AST/ALT. Workup for DIC is non-concerning (increased D-dimer but normal fibrinogen, INR, and increase in platelets). We will continue to closely monitor his labs and vitals.   Plan:  -continue Coartem 80-480 mg (20-120 tablets 4 at a time) BID. Last day of treatment tomorrow.  -switch to Tylenol 650 mg prn for fever -F/up with  parasite speciation  -G6PD deficiency screen - possible use of primaquine based on organism  -repeat CBC CMP in am  -continue PO diet  -continue Zofran 4mg q6h prn for nausea -continue Ramelteon 8mg for sleep     LOS: 0 days   Mason Dunn, Mason Dunn, Medical Student 05/14/2019, 1:03 PM  

## 2019-05-15 ENCOUNTER — Encounter (HOSPITAL_COMMUNITY): Payer: Self-pay

## 2019-05-15 DIAGNOSIS — B538 Other malaria, not elsewhere classified: Secondary | ICD-10-CM

## 2019-05-15 LAB — CBC
HCT: 30.2 % — ABNORMAL LOW (ref 39.0–52.0)
Hemoglobin: 9.9 g/dL — ABNORMAL LOW (ref 13.0–17.0)
MCH: 25.8 pg — ABNORMAL LOW (ref 26.0–34.0)
MCHC: 32.8 g/dL (ref 30.0–36.0)
MCV: 78.9 fL — ABNORMAL LOW (ref 80.0–100.0)
Platelets: 133 10*3/uL — ABNORMAL LOW (ref 150–400)
RBC: 3.83 MIL/uL — ABNORMAL LOW (ref 4.22–5.81)
RDW: 15.4 % (ref 11.5–15.5)
WBC: 6.9 10*3/uL (ref 4.0–10.5)
nRBC: 0 % (ref 0.0–0.2)

## 2019-05-15 LAB — COMPREHENSIVE METABOLIC PANEL
ALT: 48 U/L — ABNORMAL HIGH (ref 0–44)
AST: 27 U/L (ref 15–41)
Albumin: 1.8 g/dL — ABNORMAL LOW (ref 3.5–5.0)
Alkaline Phosphatase: 96 U/L (ref 38–126)
Anion gap: 7 (ref 5–15)
BUN: 9 mg/dL (ref 6–20)
CO2: 26 mmol/L (ref 22–32)
Calcium: 7.9 mg/dL — ABNORMAL LOW (ref 8.9–10.3)
Chloride: 99 mmol/L (ref 98–111)
Creatinine, Ser: 0.69 mg/dL (ref 0.61–1.24)
GFR calc Af Amer: >60 mL/min (ref >60)
GFR calc non Af Amer: >60 mL/min (ref >60)
Glucose, Bld: 127 mg/dL — ABNORMAL HIGH (ref 70–99)
Potassium: 3.5 mmol/L (ref 3.5–5.1)
Sodium: 132 mmol/L — ABNORMAL LOW (ref 135–145)
Total Bilirubin: 0.5 mg/dL (ref 0.3–1.2)
Total Protein: 6 g/dL — ABNORMAL LOW (ref 6.5–8.1)

## 2019-05-15 LAB — GLUCOSE 6 PHOSPHATE DEHYDROGENASE
G6PDH: 9 U/g{Hb} (ref 3.8–14.2)
Hemoglobin: 11.3 g/dL — ABNORMAL LOW (ref 13.0–17.7)

## 2019-05-15 MED ORDER — ARTEMETHER-LUMEFANTRINE 20-120 MG PO TABS: 4.0000 | ORAL_TABLET | Freq: Every evening | ORAL | 0 refills | Status: AC

## 2019-05-15 NOTE — Discharge Instructions (Signed)
Mr. Tinkey,   Thank you for choosing  for your medical maintenance. During your stay, you were diagnosed with malaria. You are being discharged with one last dose of your antibiotics, which you can take tonight. We will schedule you with an appointment in our clinic and will reach out to you to confirm the day. Please come back for reevaluation if you have chest pain, high grade fevers, severe muscle spasms, or an acute decline in your health.     Malaria  Malaria is a disease that is caused by a type of germ that can live inside a person's body (parasite). The malaria parasite can get into a person's blood when he or she is bitten by a certain type of mosquito (Anopheles mosquito). These mosquitoes are most common in tropical areas of the world. Usually, they are not found in the [place]. If an infected mosquito bites you, the parasites can travel through your blood to your liver. The parasites mature in your liver, then they are released into your blood. They can then invade red blood cells. The parasites multiply inside red blood cells and cause the cells to break open (rupture). This infects more red blood cells. Losing red blood cells may cause you to have a low red blood cell count (anemia). What are the causes? This condition is caused by a Plasmodium parasite. Five different types of the parasite can cause disease in humans. Most cases of malaria come from a mosquito bite, but the disease can also be passed:  Through a blood transfusion.  Through an organ transplant.  By sharing used needles or syringes.  From an infected pregnant woman to her unborn baby before or during delivery. What increases the risk? This condition is more likely to develop in:  People who live or travel in an area of the world where malaria is common.  People who receive donated blood (transfusion) or an organ transplant that is contaminated with infected blood cells.  People who share  needles with a person who is infected with malaria.  Children.  Pregnant women.  People who have never been exposed to malaria parasites before. These people have not built up protection (immunity) against the parasites. What are the signs or symptoms? Symptoms can vary depending on which parasite caused the infection. Symptoms usually come and go or occur in cycles. The first symptoms of this condition usually start 10 days to 4 weeks after the mosquito bite. Symptoms for all types of malaria usually happen in this order:  Chills, along with headache, muscle aches, fatigue, nausea, and vomiting.  Fever, along with hot and dry skin.  Headache.  Low blood pressure (hypotension).  Drenching sweats, along with weakness and exhaustion. Other symptoms include:  Diarrhea or bloody stools (feces).  Blood in the urine.  Low blood sugar (hypoglycemia).  Yellowing of the skin and the whites of the eyes (jaundice).  Enlarged spleen or liver.  Kidney function problems. Severe symptoms include:  Trouble breathing.  Seizures.  Confusion or loss of consciousness (coma).  Problems with blood clotting. How is this diagnosed? This condition may be diagnosed based on:  Your symptoms and medical history. Your health care provider may suspect malaria if you have been living or traveling in an area where the disease is common.  A physical exam.  Blood tests to confirm the diagnosis. These may include: ? Examining a blood sample under a microscope. This is the most common way to diagnose malaria. Each type of  parasite looks different under the microscope. Identifying which parasite is causing your infection will help your health care provider decide which medicines will work best for treatment. ? Complete blood count (CBC) to check for anemia. How is this treated? This condition may be treated with many different medicines or combinations of medicines, often requiring treatment in the  hospital. The best treatment for you will depend on:  Which type of parasite is causing your infection.  Whether various medicines are effective against it.  How you got the infection.  How severe your infection is.  Your age and your general health.  If you are pregnant. Follow these instructions at home:   Take over-the-counter and prescription medicines only as told by your health care provider.  Rest at home until your symptoms improve.  Drink enough fluid to keep your urine pale yellow.  Keep all follow-up visits as told by your health care provider. This is important. How is this prevented?  If you will be traveling to an area where malaria is common: ? Visit the website of the Centers for Disease Control and Prevention (CDC) to check your risk: DiscoSites.com.ee ? Visit your health care provider at least 2 weeks before you leave. You may be given a medicine to help prevent malaria.  You can also prevent malaria by: ? Using insect repellent that has diethyltoluamide (DEET) in it. ? Staying indoors after daytime starts to turn to night or dusk. ? Wearing protective clothing that covers your arms and legs. ? Hanging a mosquito net over your bed. Contact a health care provider if:  You have any malaria symptoms.  You develop jaundice. Get help right away if:  You have a seizure.  You have bleeding.  You have trouble breathing. Summary  Malaria is a disease that is caused by a type of germ that can live inside a person's body (parasite). The malaria parasite can get into a person's blood when he or she is bitten by a certain type of mosquito.  Malaria can be prevented by taking measures to prevent mosquito bites.  Treatment for malaria depends on many factors, including how severe the infection is and which type of parasite is causing it.  Follow instructions from your health care provider about medicines, rest, getting enough fluids, and  keeping all follow-up visits. This information is not intended to replace advice given to you by your health care provider. Make sure you discuss any questions you have with your health care provider. Document Released: 02/15/2004 Document Revised: 04/19/2018 Document Reviewed: 04/19/2018 Elsevier Patient Education  2020 [employer].

## 2019-05-15 NOTE — Progress Notes (Signed)
Subjective:  Patient was met at bedside this morning and interpreter was used for the interaction. He states he continues to improve and feels much better. We changed his Tylenol to prn for fever yesterday but he did not need it and remained afebrile. We informed him that he is on his last day of treatment but are still waiting results of some labs to determine if further treatment is warranted or if he can be d/c today. Patient was agreeable to the plan and had no questions.  Objective:  Vital signs in last 24 hours: Vitals:   05/14/19 0517 05/14/19 1303 05/14/19 2139 05/15/19 0612  BP: 96/72 107/76 103/77 105/71  Pulse: 83 80 71 67  Resp: _0 Temp: 98.6 F (37 C) 98.8 F (37.1 C) 98.6 F (37 C) 98.6 F (37 C)  TempSrc: Oral Oral Oral   SpO2: 98% 100% 100% 99%  Weight:      Height:       Physical Exam:  General: A&Ox3, no acute distress, well developed, well nourished.  Eyes: Anicteric, normal pallor  CV: RRR, no m/r/g  Pulm: CTAB, breathing effort normal Abd: normal active bowel sounds, non tender, no rebound or guarding  Neuro: no focal neurological deficits Psych: mood normal, affect normal   CBC Latest Ref Rng & Units 05/15/2019 05/14/2019 05/13/2019  WBC 4.0 - 10.5 K/uL 6.9 5.1 -  Hemoglobin 13.0 - 17.0 g/dL 9.9(L) 11.0(L) -  Hematocrit 39.0 - 52.0 % 30.2(L) 32.6(L) -  Platelets 150 - 400 K/uL 133(L) 73(L) 57(L)   CMP Latest Ref Rng & Units 05/15/2019 05/14/2019 05/13/2019  Glucose 70 - 99 mg/dL 127(H) 109(H) 139(H)  BUN 6 - 20 mg/dL _1 Creatinine 0.61 - 1.24 mg/dL 0.69 0.90 1.04  Sodium 135 - 145 mmol/L 132(L) 131(L) 125(L)  Potassium 3.5 - 5.1 mmol/L 3.5 3.7 4.1  Chloride 98 - 111 mmol/L 99 100 93(L)  CO2 22 - 32 mmol/L _2 Calcium 8.9 - 10.3 mg/dL 7.9(L) 7.9(L) 7.9(L)  Total Protein 6.5 - 8.1 g/dL 6.0(L) 6.2(L) 7.1  Total Bilirubin 0.3 - 1.2 mg/dL 0.5 1.1 1.1  Alkaline Phos 38 - 126 U/L 96 97 96  AST 15 - 41 U/L 27 36 95(H)  ALT 0 -  44 U/L 48(H) 69(H) 108(H)   Weight change:   Intake/Output Summary (Last 24 hours) at 05/15/2019 1149 Last data filed at 05/15/2019 0942 Gross per 24 hour  Intake 120 ml  Output -  Net 120 ml    Assessment/Plan:  Mason Dunn is a 48 yo. M with no significant PMH who presents to the hospital for insomnia after returning from [place] 1 week ago and was found to have parasites characteristic of plasmodium on blood smear.   Active Problems:   Plasmodium infection Pathologist confirmation of parasite still pending but organisms seen on blood smear is characteristic of plasmodium. He is on the last day of Coartem treatment and has tolerated it well. We are still waiting on results of speciation and G6PD results and will add primaquine as indicated.   His vital signs have been stable and he is experiencing a mild form of malaria. He has remained afebrile for >24 hours without Tylenol. Morning labs show a drop in Hgb 11->9.9 but no significant change in T Bili (0.5). This is suggestive of mild hemolysis due to organism and should improve with completion of treatment. LDH was elevated the previous day and there is no  indication of other source of blood loss such as bleed. He does not have any sx of anemia and has no signs of jaundice. Other labs including Na, ALT/AST, platelets continue to trend toward normal. We will continue to closely monitor his labs and vitals.   Plan:  -complete Coartem 80-480 mg (20-120 tablets 4 at a time) BID today.  -continue Tylenol 650 mg prn for fever -f/u with parasite speciation  -f/u G6PD deficiency screen - possible use of primaquine based on organism  -repeat CBC CMP in am  -continue PO diet  -continue Zofran 102m q6h prn for nausea -continue Ramelteon 870mfor sleep     LOS: 1 day   YuMelina ModenaMedical Student 05/15/2019, 11:49 AM

## 2019-05-15 NOTE — Plan of Care (Signed)
  Problem: Education: Goal: Knowledge of General Education information will improve Description: Including pain rating scale, medication(s)/side effects and non-pharmacologic comfort measures Outcome: Progressing   Problem: Health Behavior/Discharge Planning: Goal: Ability to manage health-related needs will improve Outcome: Progressing   Problem: Clinical Measurements: Goal: Ability to maintain clinical measurements within normal limits will improve Outcome: Progressing Goal: Will remain free from infection Outcome: Progressing Goal: Diagnostic test results will improve Outcome: Progressing   Problem: Coping: Goal: Level of anxiety will decrease Outcome: Progressing   Problem: Pain Managment: Goal: General experience of comfort will improve Outcome: Progressing   

## 2019-05-16 LAB — PARASITE EXAM, BLOOD: Parasite Exam, Blood: POSITIVE — AB

## 2019-05-16 NOTE — Discharge Summary (Signed)
Name: Mason Dunn MRN: [id number] DOB: Feb 17, 1971 48 y.o. PCP: Patient, No Pcp Per  Date of Admission: 05/13/2019  5:04 AM Date of Discharge: 05/15/2019 Attending Physician: Dr. Heide Spark   Discharge Diagnosis: 1. Mild plasmodium unspecified infection, malaria  Discharge Medications: Allergies as of 05/15/2019   No Known Allergies     Medication List    TAKE these medications   artemether-lumefantrine 20-120 MG Tabs tablet Commonly known as: COARTEM Take 4 tablets by mouth every evening.   doxylamine (Sleep) 25 MG tablet Commonly known as: UNISOM Take 25 mg by mouth at bedtime as needed for sleep.   multivitamin with minerals Tabs tablet Take 1 tablet by mouth daily.      Disposition and follow-up:   Mason Dunn was discharged from [facility] in stable condition.  At the hospital follow up visit please address:  1.  Follow up with internal medicine clinic on Monday. We want to check in with how you are doing symptom wise and follow up on some test to make sure we have cleared the malaria from your system.   2.  Labs / imaging needed at time of follow-up:CBC and CMP for Hgb and liver function. Blood smear to check for resolution of infection.   3.  Pending labs/ test needing follow-up: Results of plasmodium speciation, G6PD level   Follow-up Appointments: Follow-up Information    [facility]. Call in 4 day(s).   Why: If they do not call within 2-3 days, please call them.  Contact information: [address] [place] [address] [phone]          Hospital Course by problem list: 1. Patient was seen at the [facility] ED for complaints of insomnia and fatigue. He was found to have a fever and blood work subsequently revealed presence of malaria. 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 He was  hemodynamically stable and workup for significant hemolysis and DIC was negative. He responded well to treatment and showed clinical improvement over the 3 days of therapy. Speciation of plasmodium and G6PD were pending, so he was discharged on the last day of therapy with plan to follow up in clinic the following week.   Discharge Vitals:   BP 109/79 (BP Location: Right Arm)   Pulse 68   Temp 98.6 F (37 C) (Oral)   Resp 18   Ht 5\' 8"  (1.727 m)   Wt 90.7 kg   SpO2 100%   BMI 30.41 kg/m   Pertinent Labs, Studies, and Procedures:   CBC Latest Ref Rng & Units 05/15/2019 05/14/2019 05/14/2019  WBC 4.0 - 10.5 K/uL 6.9 5.1 -  Hemoglobin 13.0 - 17.0 g/dL 05/16/2019) 11.3(L) 11.0(L)  Hematocrit 39.0 - 52.0 % 30.2(L) 32.6(L) -  Platelets 150 - 400 K/uL 133(L) 73(L) -   CMP Latest Ref Rng & Units 05/15/2019 05/14/2019 05/13/2019  Glucose 70 - 99 mg/dL 05/15/2019) 854(O) 270(J)  BUN 6 - 20 mg/dL 9 20 15   Creatinine 0.61 - 1.24 mg/dL 500(X 3.81  Sodium 135 - 145 mmol/L 132(L) 131(L) 125(L)  Potassium 3.5 - 5.1 mmol/L 3.5 3.7 4.1  Chloride 98 - 111 mmol/L 99 100 93(L)  CO2 22 - 32 mmol/L 26 24 22   Calcium 8.9 - 10.3 mg/dL 7.9(L) 7.9(L) 7.9(L)  Total Protein 6.5 - 8.1 g/dL 6.0(L) 6.2(L) 7.1  Total Bilirubin 0.3 -  1.2 mg/dL 0.5 1.1 1.1  Alkaline Phos 38 - 126 U/L 96 97 96  AST 15 - 41 U/L 27 36 95(H)  ALT 0 - 44 U/L 48(H) 69(H) 108(H)   G6PDH 3.8 - 14.2 U/g Hb 9.0    Parasite exam:  1.6% parasitemia. Most closely resembles P.falciparum.  Consider PCR to further aid in speciation.  Discharge Instructions: Discharge Instructions    Call MD for:  difficulty breathing, headache or visual disturbances   Complete by: As directed    Call MD for:  persistant nausea and vomiting   Complete by: As directed    Call MD for:  redness, tenderness, or signs of infection (pain, swelling, redness, odor or green/yellow discharge around incision site)   Complete by: As directed    Call MD for:  severe  uncontrolled pain   Complete by: As directed    Call MD for:  temperature >100.4   Complete by: As directed    Diet - low sodium heart healthy   Complete by: As directed    Increase activity slowly   Complete by: As directed      Signed: Melina Modena, Medical Student 05/17/2019, 7:28 AM   Pager: [place] personally reviewed and worked with this patient, and discussed the plan with the medical staff. I agree with the assessment and details found within the medical student's note.  Maudie Mercury, MD IMTS, PGY-1 Pager: [phone] 05/17/2019,3:58 PM

## 2019-05-16 NOTE — Progress Notes (Signed)
Norva Riffle to be D/C'd home er MD order.  Discussed with the patient and all questions fully answered.  VSS, Skin clean, dry and intact without evidence of skin break down, no evidence of skin tears noted. IV catheter discontinued intact. Site without signs and symptoms of complications. Dressing and pressure applied.  An After Visit Summary was printed and given to the patient. Patient received prescription.  D/c education completed with patient/family including follow up instructions, medication list, d/c activities limitations if indicated, with other d/c instructions as indicated by MD - patient able to verbalize understanding, all questions fully answered.   Patient instructed to return to ED, call 911, or call MD for any changes in condition.   Patient escorted via [place], and D/C home via private auto.  Jeanella Craze 05/15/2019 18:30PM

## 2019-05-26 ENCOUNTER — Ambulatory Visit (INDEPENDENT_AMBULATORY_CARE_PROVIDER_SITE_OTHER): Payer: Medicaid Other | Admitting: Internal Medicine

## 2019-05-26 VITALS — BP 117/76 | HR 97 | Temp 99.0°F | Ht 67.0 in | Wt 187.0 lb

## 2019-05-26 DIAGNOSIS — M549 Dorsalgia, unspecified: Secondary | ICD-10-CM

## 2019-05-26 DIAGNOSIS — R222 Localized swelling, mass and lump, trunk: Secondary | ICD-10-CM | POA: Diagnosis not present

## 2019-05-26 DIAGNOSIS — B54 Unspecified malaria: Secondary | ICD-10-CM | POA: Diagnosis not present

## 2019-05-26 LAB — CBC
HCT: 35 % — ABNORMAL LOW (ref 39.0–52.0)
Hemoglobin: 11 g/dL — ABNORMAL LOW (ref 13.0–17.0)
MCH: 26.4 pg (ref 26.0–34.0)
MCHC: 31.4 g/dL (ref 30.0–36.0)
MCV: 84.1 fL (ref 80.0–100.0)
Platelets: 460 10*3/uL — ABNORMAL HIGH (ref 150–400)
RBC: 4.16 MIL/uL — ABNORMAL LOW (ref 4.22–5.81)
RDW: 17 % — ABNORMAL HIGH (ref 11.5–15.5)
WBC: 7.5 10*3/uL (ref 4.0–10.5)
nRBC: 0 % (ref 0.0–0.2)

## 2019-05-26 LAB — COMPREHENSIVE METABOLIC PANEL
ALT: 40 U/L (ref 0–44)
AST: 29 U/L (ref 15–41)
Albumin: 3.2 g/dL — ABNORMAL LOW (ref 3.5–5.0)
Alkaline Phosphatase: 106 U/L (ref 38–126)
Anion gap: 7 (ref 5–15)
BUN: 7 mg/dL (ref 6–20)
CO2: 23 mmol/L (ref 22–32)
Calcium: 9.2 mg/dL (ref 8.9–10.3)
Chloride: 106 mmol/L (ref 98–111)
Creatinine, Ser: 0.76 mg/dL (ref 0.61–1.24)
GFR calc Af Amer: >60 mL/min (ref >60)
GFR calc non Af Amer: >60 mL/min (ref >60)
Glucose, Bld: 94 mg/dL (ref 70–99)
Potassium: 4.3 mmol/L (ref 3.5–5.1)
Sodium: 136 mmol/L (ref 135–145)
Total Bilirubin: 0.5 mg/dL (ref 0.3–1.2)
Total Protein: 8.2 g/dL — ABNORMAL HIGH (ref 6.5–8.1)

## 2019-05-26 MED ORDER — IBUPROFEN 200 MG PO TABS: 400.0000 mg | ORAL_TABLET | Freq: Three times a day (TID) | ORAL | 0 refills | Status: AC

## 2019-05-26 NOTE — Progress Notes (Signed)
   CC: Back Pain and Hospital Follow Up  HPI:  [name] is a 48 y.o. is male, with plasmodium infection, who presents to the clinic with back pain and blood work. To see the management of his acute and chronic conditions, please see the A&P under the encounters tab.   No past medical history on file. Review of Systems: Review of Systems  Constitutional: Negative for chills, fever and weight loss.  Eyes: Negative for blurred vision, double vision and photophobia.  Cardiovascular: Negative for chest pain and palpitations.  Gastrointestinal: Negative for abdominal pain, blood in stool, constipation, diarrhea, nausea and vomiting.  Musculoskeletal: Negative for myalgias.  Neurological: Negative for dizziness and headaches.    Physical Exam:  Vitals:   05/26/19 1328  BP: 117/76  Pulse: 97  Temp: 99 F (37.2 C)  TempSrc: Oral  SpO2: 100%  Weight: 187 lb (84.8 kg)  Height: 5\' 7"  (1.702 m)   Physical Exam Vitals signs reviewed.  Constitutional:      General: He is not in acute distress.    Appearance: Normal appearance. He is not ill-appearing or toxic-appearing.  HENT:     Head: Normocephalic and atraumatic.  Cardiovascular:     Rate and Rhythm: Normal rate and regular rhythm.     Pulses: Normal pulses.     Heart sounds: Normal heart sounds. No murmur. No friction rub. No gallop.   Pulmonary:     Effort: Pulmonary effort is normal. No respiratory distress.     Breath sounds: Normal breath sounds. No wheezing, rhonchi or rales.  Abdominal:     General: Abdomen is flat. Bowel sounds are normal.     Tenderness: There is no abdominal tenderness. There is no guarding.  Musculoskeletal:        General: No tenderness.     Right lower leg: No edema.     Left lower leg: No edema.     Comments: Painful mobile mass palpated in the lower left lumbar region.  Neurological:     Mental Status: He is alert and oriented to person, place, and time.  Psychiatric:        Mood and  Affect: Mood normal.        Behavior: Behavior normal.     Assessment & Plan:   See Encounters Tab for problem based charting.  Patient seen with Dr. Lynnae January

## 2019-05-26 NOTE — Assessment & Plan Note (Signed)
Patient presents to the clinic with complaints of a back mass that he has had since 2018. 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 Patient states that it hasn't hurt for 2 years, but has recently noticed it hurting him when he sits in his forklift, or any seat. Solid, painful mobile, mass palpated in the region of L3-L5. W Plan:  - Ultrasound of Spine - Patient to come back in 2 weeks for follow up on imaging and next steps, if there are any indicated.

## 2019-05-26 NOTE — Assessment & Plan Note (Signed)
Patient states that he is doing well. He denies any new fever or fatigue episodes. He states that he is feeling better than when he was hospitalized. 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  Plan:  - Ordered CBC - Ordered CMP - Ordered a blood smear for pathology review.

## 2019-05-26 NOTE — Patient Instructions (Addendum)
Mr. Mason Dunn,   It was a pleasure working with you today. We discussed getting blood work for your infection, and following up on them. We also discussed your back pain and the mass in your lower left back. We will give you some medication for your back pain, and you will have a referral to get an ultrasound of your back. We will see you in approximately 2 weeks. We look forward to working with you again! Sincerely,  Maudie Mercury MD

## 2019-05-28 LAB — PATHOLOGIST SMEAR REVIEW

## 2019-06-02 NOTE — Progress Notes (Signed)
Internal Medicine Clinic Attending  I saw and evaluated the patient.  I personally confirmed the key portions of the history and exam documented by Dr. Winters and I reviewed pertinent patient test results.  The assessment, diagnosis, and plan were formulated together and I agree with the documentation in the resident's note.  

## 2019-06-06 ENCOUNTER — Ambulatory Visit (HOSPITAL_COMMUNITY)
Admission: RE | Admit: 2019-06-06 | Discharge: 2019-06-06 | Disposition: A | Discharge status: Home / Self Care | Payer: Medicaid Other | Source: Ambulatory Visit | Attending: Internal Medicine | Admitting: Internal Medicine

## 2019-06-06 ENCOUNTER — Other Ambulatory Visit: Payer: Self-pay | Admitting: Internal Medicine

## 2019-06-06 ENCOUNTER — Other Ambulatory Visit: Payer: Self-pay

## 2019-06-06 DIAGNOSIS — R222 Localized swelling, mass and lump, trunk: Secondary | ICD-10-CM | POA: Diagnosis not present

## 2019-06-09 ENCOUNTER — Other Ambulatory Visit: Payer: Self-pay | Admitting: Internal Medicine

## 2019-06-09 DIAGNOSIS — M899 Disorder of bone, unspecified: Secondary | ICD-10-CM

## 2019-06-09 NOTE — Progress Notes (Signed)
MRI of lumbar area ordered after abnormal findings on ultrasound.  Patient is Arabic speaking so we will need to arrange for a translator to call and explained the need.  Additionally, he is self-pay and we need to encourage him to apply for financial assistance.

## 2019-07-09 ENCOUNTER — Ambulatory Visit (HOSPITAL_COMMUNITY)
Admission: RE | Admit: 2019-07-09 | Discharge: 2019-07-09 | Disposition: A | Discharge status: Home / Self Care | Payer: Self-pay | Source: Ambulatory Visit | Attending: Internal Medicine | Admitting: Internal Medicine

## 2019-07-09 NOTE — Progress Notes (Signed)
Pt was a NO SHOW for MRI on 07-09-19.

## 2020-10-18 IMAGING — DX DG CHEST 1V PORT
1 series · 1 of 1 positions shown · non-contrast
Comparison: 02/07/2012

CLINICAL DATA: Lack of sleep for 1 week, insomnia.

EXAM:
PORTABLE CHEST 1 VIEW

[chest ap]
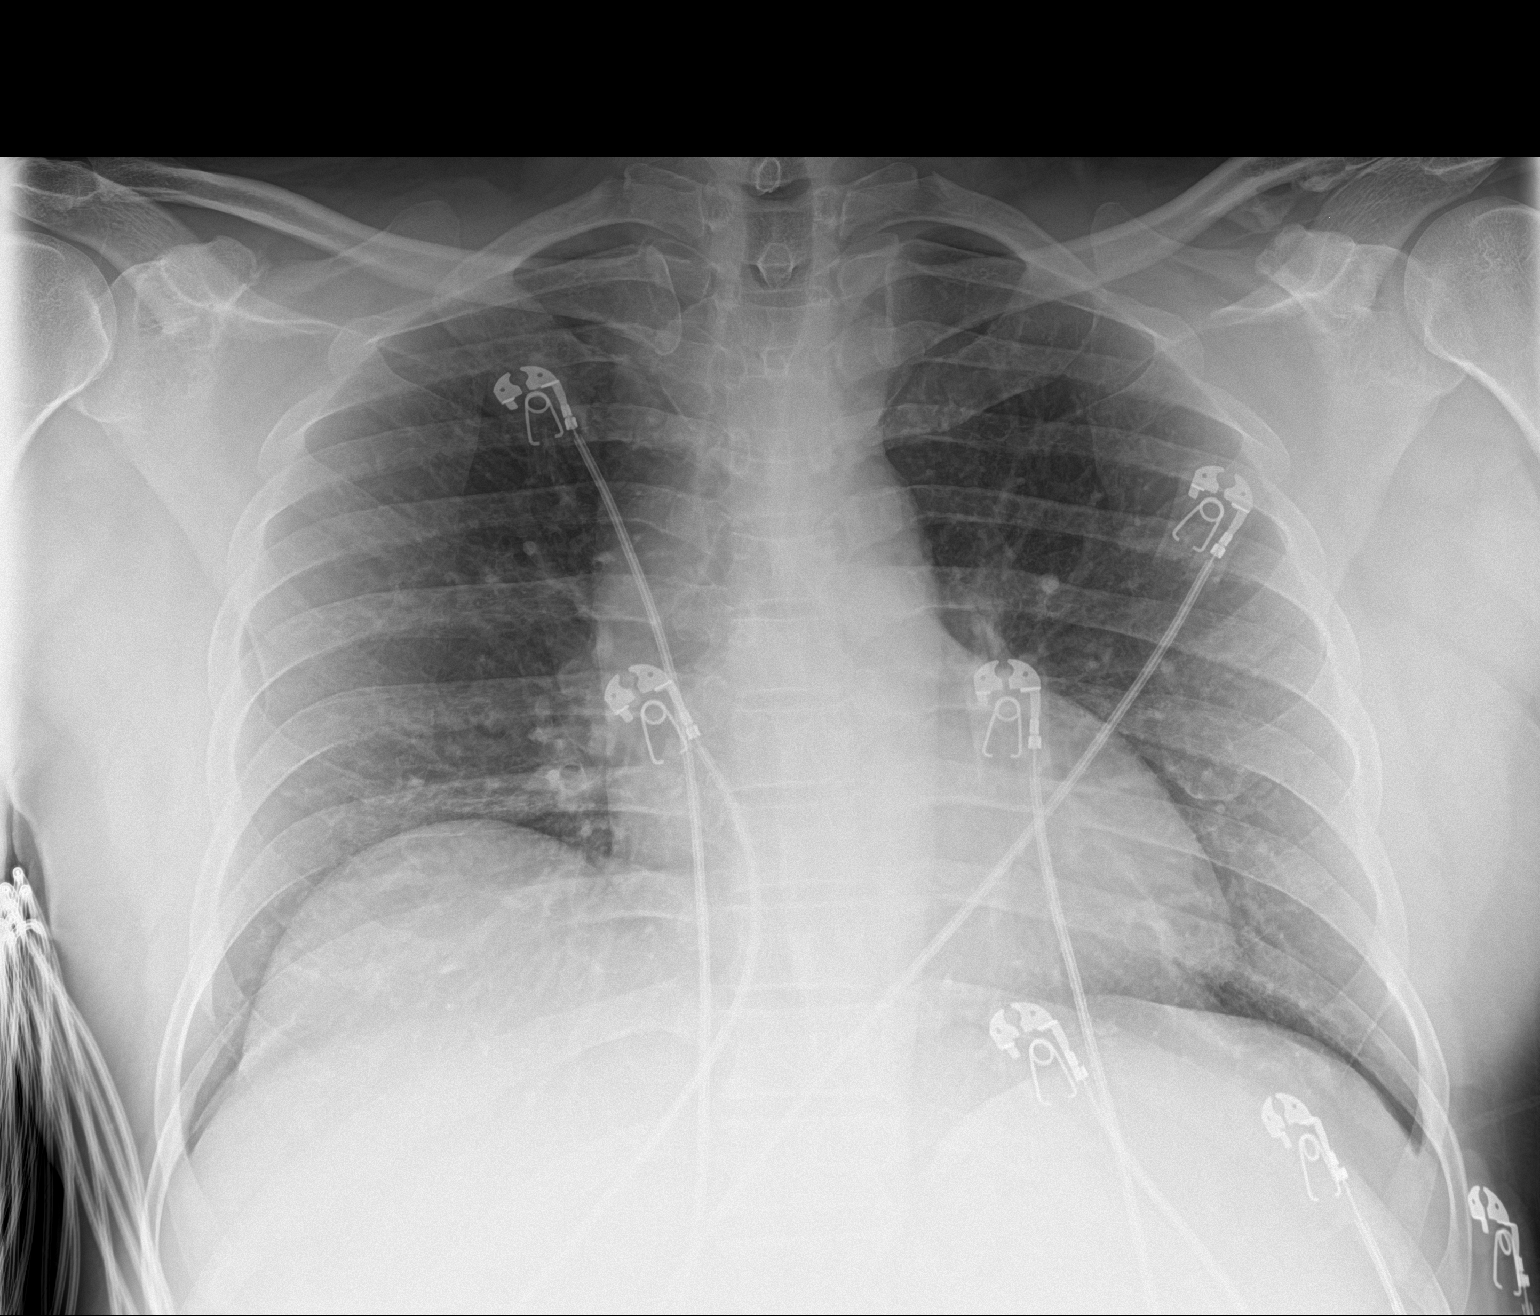

[1 of 1 positions shown; findings below may reference images not displayed]

FINDINGS: The heart size and mediastinal contours are within normal limits.
Both lungs are clear. The visualized skeletal structures are
unremarkable.
IMPRESSION: No active disease.

## 2020-11-11 IMAGING — US US PELVIS LIMITED
1 series · 14 of 18 positions shown · non-contrast
Comparison: None.

CLINICAL DATA: Initial evaluation for painful left-sided lumbar
mobile mass.

EXAM:
LIMITED ULTRASOUND OF PELVIS
TECHNIQUE: Limited transabdominal ultrasound examination of the pelvis was
performed.

[Series 1: us pelvis limited · 18 acquisitions, 14 frames shown]
[im 1/18]
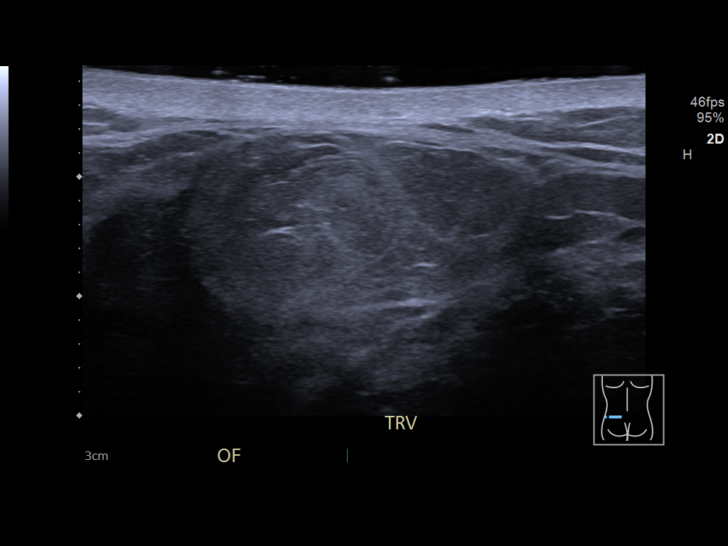
[im 2/18]
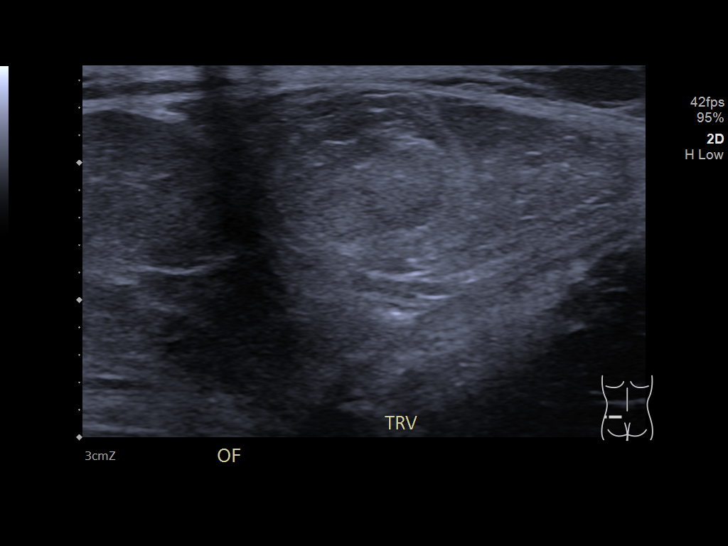
[im 4/18]
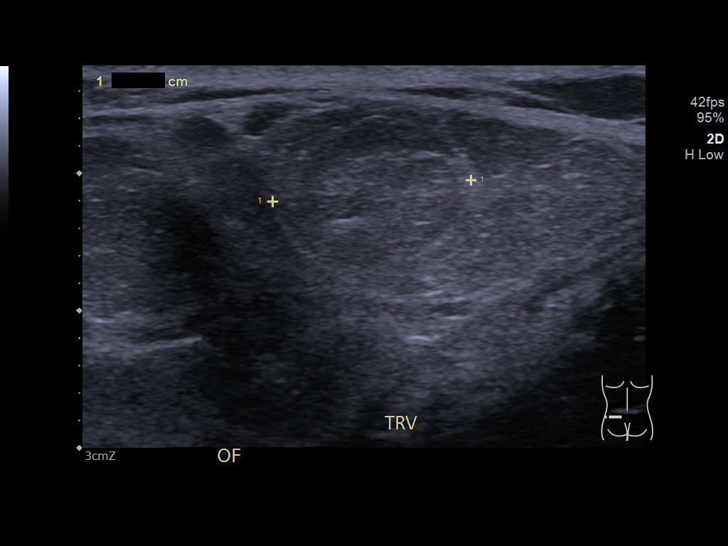
[im 5/18]
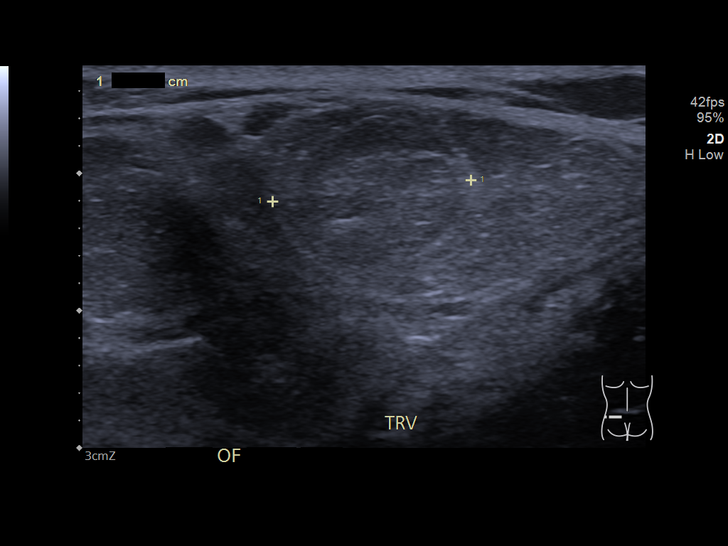
[im 6/18]
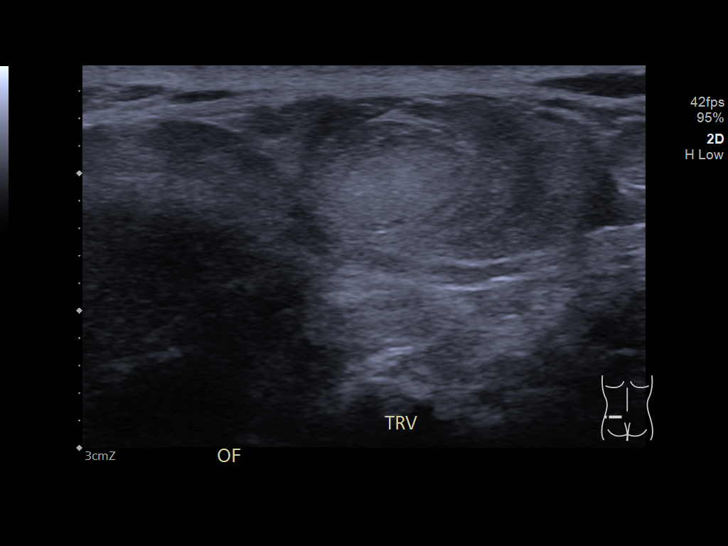
[im 8/18]
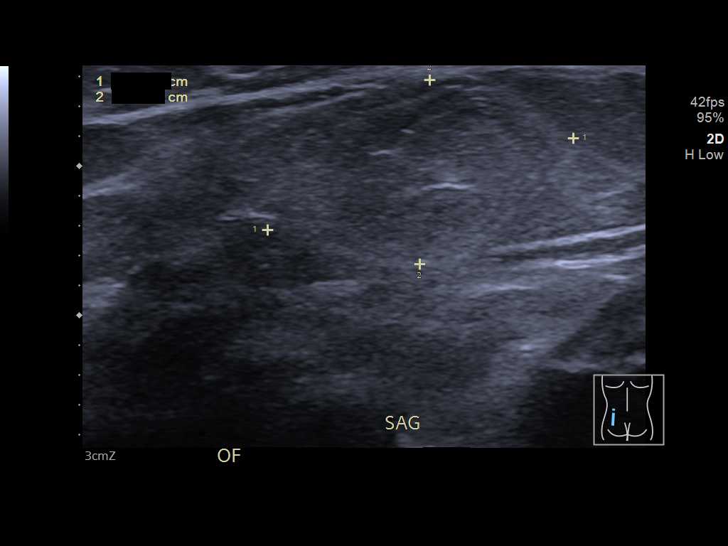
[im 9/18]
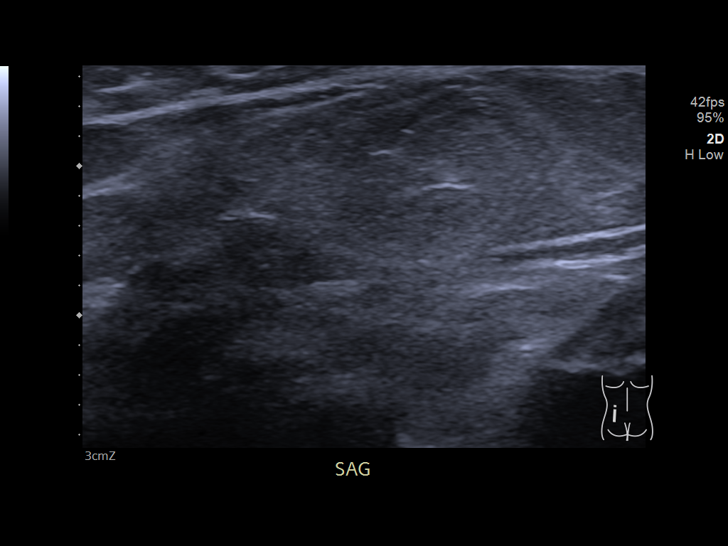
[im 10/18]
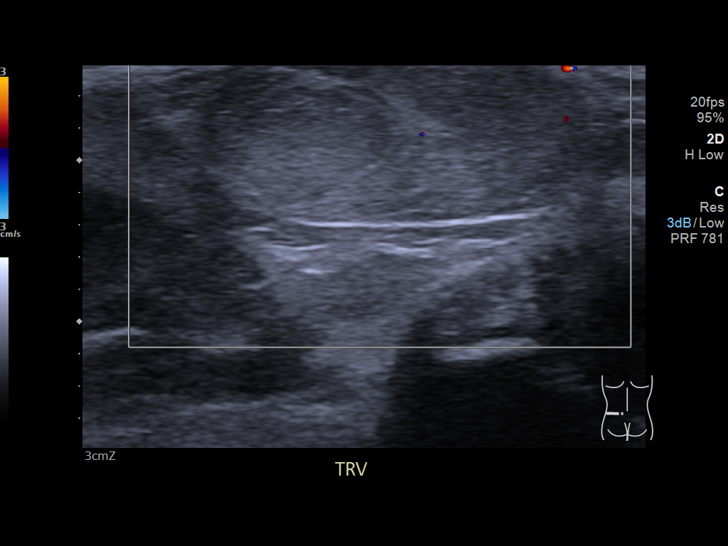
[im 11/18]
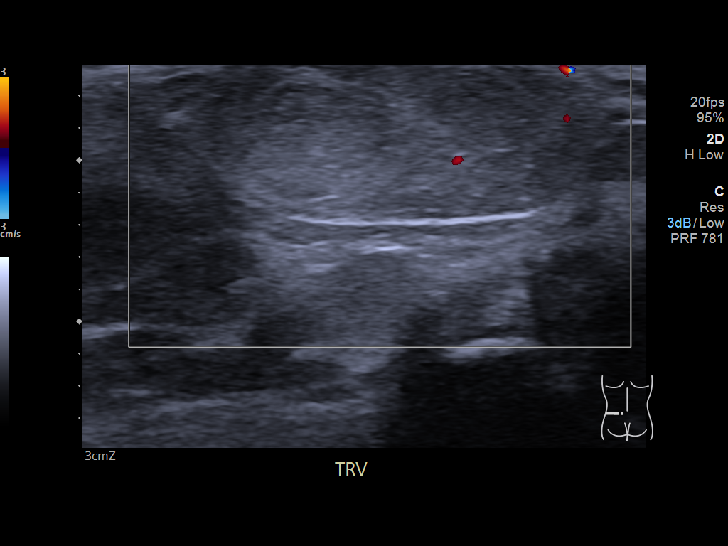
[im 13/18]
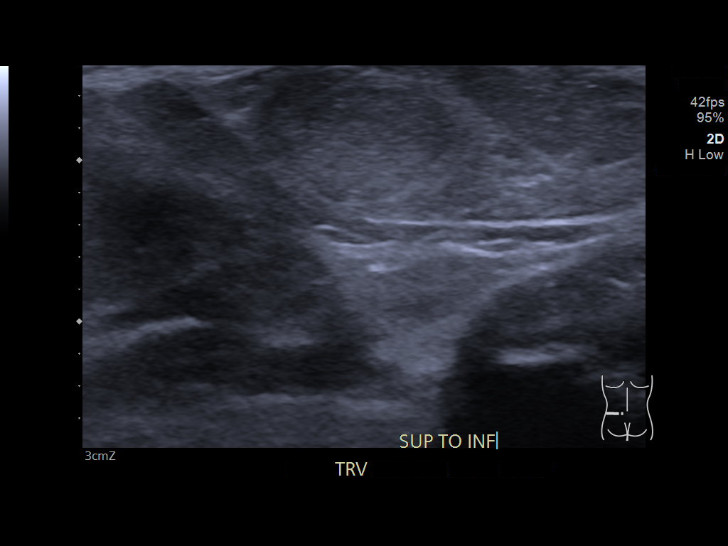
[im 14/18]
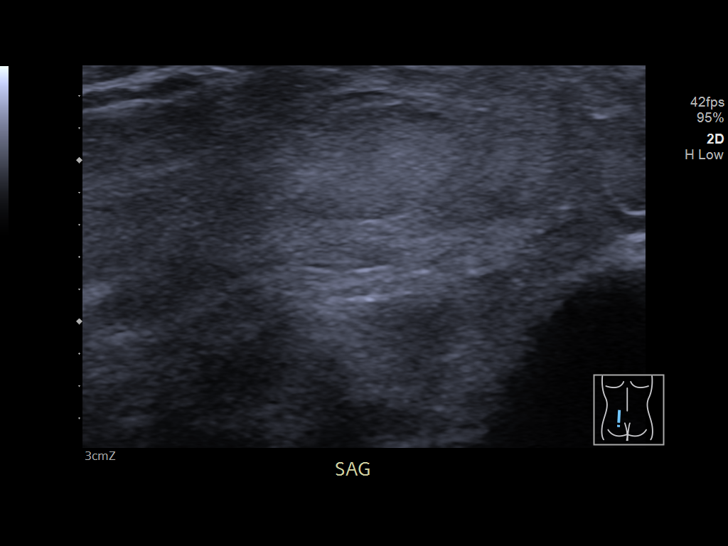
[im 15/18]
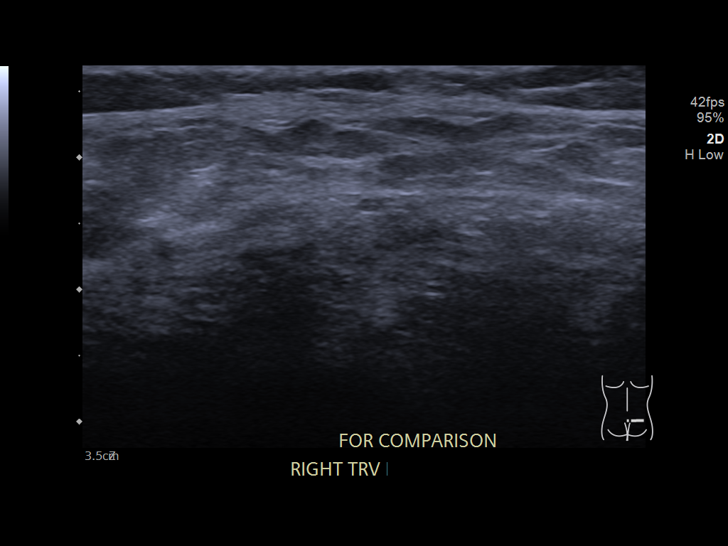
[im 17/18]
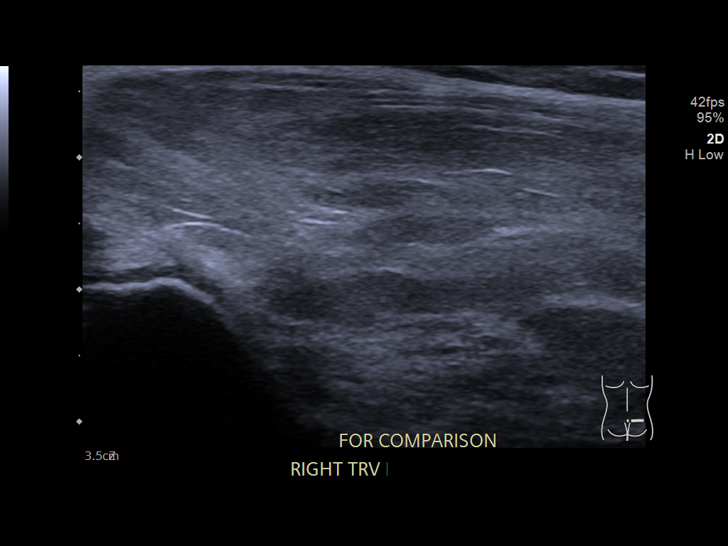
[im 18/18]
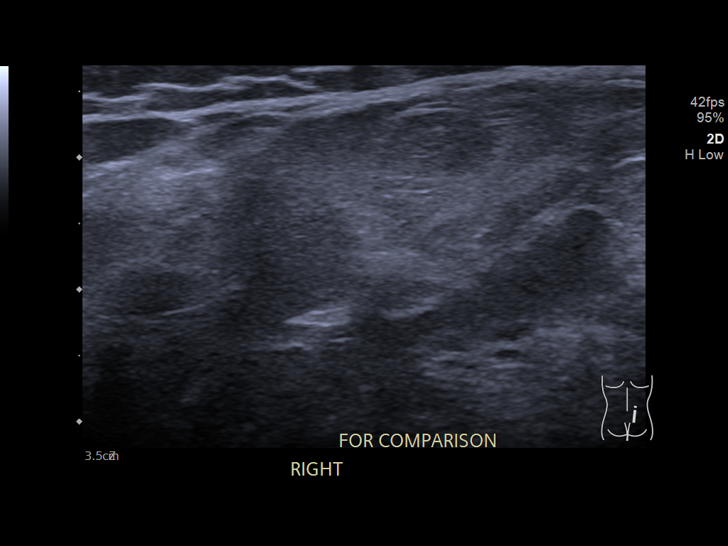

[14 of 18 positions shown; findings below may reference images not displayed]

FINDINGS: Targeted ultrasound of a palpable area of concern at the lower left
back was performed. Ultrasound demonstrates a fairly
well-circumscribed ovoid solid lesion measuring 2.1 x 1.2 x 1.5 cm.
This is positioned just deep to the dermis. Lesion is largely
isoechoic in appearance to surrounding fat with few scattered
internal septations. No associated increased vascularity.
Surrounding soft tissues are relatively normal in appearance.
IMPRESSION: 2.1 x 1.2 x 1.5 cm solid lesion involving the soft tissues of the
left lower back, corresponding with palpable abnormality of concern.
While this lesion could reflect a simple lipoma, this has somewhat
indeterminate features by ultrasound. Further evaluation with
dedicated MRI, with and without contrast, recommended for further
characterization.

## 2024-03-31 ENCOUNTER — Encounter (HOSPITAL_COMMUNITY): Payer: Self-pay

## 2024-03-31 ENCOUNTER — Emergency Department (HOSPITAL_COMMUNITY)

## 2024-03-31 ENCOUNTER — Other Ambulatory Visit: Payer: Self-pay

## 2024-03-31 ENCOUNTER — Emergency Department (HOSPITAL_COMMUNITY)
Admission: EM | Admit: 2024-03-31 | Discharge: 2024-04-01 | Discharge status: Against Medical Advice | Attending: Emergency Medicine | Admitting: Emergency Medicine

## 2024-03-31 DIAGNOSIS — R222 Localized swelling, mass and lump, trunk: Secondary | ICD-10-CM

## 2024-03-31 DIAGNOSIS — M545 Low back pain, unspecified: Secondary | ICD-10-CM | POA: Diagnosis present

## 2024-03-31 LAB — CBC WITH DIFFERENTIAL/PLATELET
Abs Immature Granulocytes: 0.02 K/uL (ref 0.00–0.07)
Basophils Absolute: 0 K/uL (ref 0.0–0.1)
Basophils Relative: 1 %
Eosinophils Absolute: 0.1 K/uL (ref 0.0–0.5)
Eosinophils Relative: 1 %
HCT: 45 % (ref 39.0–52.0)
Hemoglobin: 14.4 g/dL (ref 13.0–17.0)
Immature Granulocytes: 0 %
Lymphocytes Relative: 26 %
Lymphs Abs: 2.1 K/uL (ref 0.7–4.0)
MCH: 26.6 pg (ref 26.0–34.0)
MCHC: 32 g/dL (ref 30.0–36.0)
MCV: 83.2 fL (ref 80.0–100.0)
Monocytes Absolute: 0.5 K/uL (ref 0.1–1.0)
Monocytes Relative: 6 %
Neutro Abs: 5.2 K/uL (ref 1.7–7.7)
Neutrophils Relative %: 66 %
Platelets: 321 K/uL (ref 150–400)
RBC: 5.41 MIL/uL (ref 4.22–5.81)
RDW: 14 % (ref 11.5–15.5)
WBC: 7.9 K/uL (ref 4.0–10.5)
nRBC: 0 % (ref 0.0–0.2)

## 2024-03-31 LAB — COMPREHENSIVE METABOLIC PANEL WITH GFR
ALT: 29 U/L (ref 0–44)
AST: 18 U/L (ref 15–41)
Albumin: 3.6 g/dL (ref 3.5–5.0)
Alkaline Phosphatase: 73 U/L (ref 38–126)
Anion gap: 10 (ref 5–15)
BUN: 6 mg/dL (ref 6–20)
CO2: 23 mmol/L (ref 22–32)
Calcium: 9.3 mg/dL (ref 8.9–10.3)
Chloride: 105 mmol/L (ref 98–111)
Creatinine, Ser: 0.74 mg/dL (ref 0.61–1.24)
GFR, Estimated: >60 mL/min (ref >60)
Glucose, Bld: 108 mg/dL — ABNORMAL HIGH (ref 70–99)
Potassium: 3.9 mmol/L (ref 3.5–5.1)
Sodium: 138 mmol/L (ref 135–145)
Total Bilirubin: <0.2 mg/dL (ref 0.0–1.2)
Total Protein: 7.5 g/dL (ref 6.5–8.1)

## 2024-03-31 LAB — LIPASE, BLOOD: Lipase: 31 U/L (ref 11–51)

## 2024-03-31 NOTE — ED Provider Triage Note (Signed)
 Emergency Medicine Provider Triage Evaluation Note  Mason Dunn , a 53 y.o. male  was evaluated in triage.  Pt complains of back pain. He reports years of low back pain with two areas of movable firm balls in his lumbar spine. Did also remark about GI concerns with nausea, vomiting, and diarrhea but largely improving. No longer feeling much abdominal pain.  Review of Systems  Positive: As above Negative: As above  Physical Exam  BP (!) 142/99   Pulse 74   Temp 98.1 F (36.7 C)   Resp 18   Ht 5' 7 (1.702 m)   Wt 86.2 kg   SpO2 97%   BMI 29.76 kg/m  Gen:   Awake, no distress   Resp:  Normal effort MSK:   Moves extremities without difficulty  Other:    Medical Decision Making  Medically screening exam initiated at 2:26 PM.  Appropriate orders placed.  Mason Dunn was informed that the remainder of the evaluation will be completed by another provider, this initial triage assessment does not replace that evaluation, and the importance of remaining in the ED until their evaluation is complete.     Harland Aguiniga A, PA-C 03/31/24 1427

## 2024-03-31 NOTE — ED Triage Notes (Signed)
 Pt c/o chest pain that started 2 days ago. Pt denies nausea or vomiting or shortness of breath. Pt has had similar episodes in the past. Pt also c/o lower back pain.

## 2024-03-31 NOTE — ED Notes (Signed)
 Pt is back in waiting room. KM

## 2024-03-31 NOTE — ED Notes (Addendum)
 [employer] utilized for triage. Pt clarifies that he was having epigastric pain for the last 2 days with associated chills, decreased appetite. Denies N/V/D, ShOB. Pt is concerned he may have food poisoning because he ate some leftover lamb. Symptoms started 2 hours after eating.   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

## 2024-03-31 NOTE — ED Notes (Signed)
 No answer for room

## 2024-04-01 NOTE — Discharge Instructions (Addendum)
 Thank you, Mr. Mason Dunn, for allowing us  to provide your care today. Today we discussed . . .  > Lower back mass       - He will need an MRI of this.  I have arranged for you to follow-up with our internal medicine clinic tomorrow at 9:45 AM.  The address is [address], [place], [place].  You will need to go to this appointment to have the MRI ordered.    I have ordered the following labs for you:   Lab Orders         CBC with Differential         Comprehensive metabolic panel         Lipase, blood       Follow up: Tomorrow with Dr. Damien Lease at 9:45       [facility], [place]  ????? ??? ????? ??? ???????? ??? ????? ?????? ??? ?????? ??????? ?? ?????. ?????? ?????...  > ???? ???? ????? - ?????? ??? ????? ??????? ?????????? ????. ??? ????? ?? ?????? ???????? ?? ????? ???? ??????? ???? ?????? 9:45 ??????. ???????: 301 ??. ??????? ?????? ???? 100? ?????????? ????????? ????????. ?????? ??? ?????? ???? ?????? ???? ????? ??????? ??????????.  ??? ????? ?? ???????? ???????? ???????:  ????? ???????? ???????? ????? ???? ?????? ?? ?????? ???? ??????? ??????? ??????? ???????? ????  ????????: ???? ?? ???????? ????? ????? ?????? 9:45 shkran lika, alsayid hasan 'iismaeil, ealaa 'iitahat alfursat lana litaqdim alrieayat lak alyawma. naqashna alyawma... > kutlat 'asfal alzuhr - sayahtaj 'iilaa taswir bialranin almighnatisii lihadha. laqad rtbt lak mwedan lilmutabaeat mae eiadat altibi albatinii ghdan alsaaeat 9:45 sbahan. aleunwan: 31 'ii. windufar 'afinyu, janah 100, ghrinzburu, karulayna alshamaliati. satahtaj 'iilaa alhudur lihadha almaweid litalab taswir bialranin almighnatisii. laqad tlbt lak alfuhusat almakhbiriat altaaliata: talabat alfuhusat almukhbiria tiedad aldam alkamil mae tafaduli lawhat altamthil alghidhayiyi alshaamila alliybazu, aldam almutabaeatu: ghdan mae aldukturat 'iimili bindar alsaaeat 9:45

## 2024-04-01 NOTE — ED Provider Notes (Signed)
  EMERGENCY DEPARTMENT AT [facility] Provider Note   CSN: [id number] Arrival date & time: 03/31/24  1240     Patient presents with: Abdominal Pain and Back Pain   Mason Dunn is a 53 y.o. male with PMH of malaria and a back mass who presents to the ED today for evaluation of his lower back pain. His pain has been present for about 6 years now and has worsened slowly over time.  He is also concerned about palpable masses in his bilateral lower back right worse than left.  On chart review these have been present since 2020.  He rates his pain as an 8 out of 10.  He does have pain at night but it does not wake him from sleep.  When he is running this increases his pain.  His pain waxes and wanes over time.  He was given a some pain medication from an outside hospital but does not know what it is.  His pain is currently rated as an 8 out of 10.  He denies any radicular symptoms.  He denies saddle anesthesia.  He does endorse erectile dysfunction and occasional loss of bladder control.  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  He does not have any bowel incontinence.  He denies chest pain, abdominal pain, nausea, vomiting, headaches, sensation deficits, motor disturbances.  He moved from [place] 5 or 6 years ago and has not established with a primary care doctor since then.  Visit was interpreted into arabic by televideo service.     No medications  Allergies: Patient has no known allergies.    Review of Systems  Gastrointestinal:  Negative for abdominal pain.  Musculoskeletal:  Positive for back pain.    Updated Vital Signs BP (!) 154/105 (BP Location: Left Arm)   Pulse 70   Temp 97.9 F (36.6 C)   Resp 16   Ht 5' 7 (1.702 m)   Wt 86.2 kg   SpO2 100%   BMI 29.76 kg/m   Physical  Exam Constitutional:      General: He is not in acute distress.    Appearance: He is not ill-appearing.  Cardiovascular:     Rate and Rhythm: Normal rate and regular rhythm.  Abdominal:     General: Abdomen is flat. There is no distension.     Tenderness: There is no abdominal tenderness.  Musculoskeletal:     Lumbar back: Deformity present. No swelling, lacerations, spasms or tenderness. Normal range of motion. Negative right straight leg raise test and negative left straight leg raise test.     Comments: Nontender, mobile masses bilaterally around L4 region.  About 3 x 5 cm bilaterally.  Neurological:     Mental Status: He is alert.     Comments: 5 out of 5 strength the lower extremities.  Normal sensation bilaterally     (all labs ordered are listed, but only abnormal results are displayed) Labs Reviewed  COMPREHENSIVE METABOLIC PANEL WITH GFR - Abnormal; Notable for the following components:      Result Value   Glucose, Bld 108 (*)    All other components within normal limits  CBC WITH DIFFERENTIAL/PLATELET  LIPASE, BLOOD    EKG: EKG Interpretation Date/Time:  Monday March 31 2024 12:55:42 EDT Ventricular Rate:  69 PR Interval:  180 QRS Duration:  88 QT Interval:  356 QTC Calculation: 381 R Axis:   -22  Text Interpretation: Normal sinus rhythm Cannot rule out Anterior infarct , age undetermined Abnormal ECG When compared with ECG of 13-May-2019 09:41, No significant change was found Confirmed by Raford Lenis ([id number]) on 04/01/2024 12:02:56 AM  Radiology: [facility] Lumbar Spine Complete Result Date: 03/31/2024 CLINICAL DATA:  Low back pain. EXAM: LUMBAR SPINE - COMPLETE 4+ VIEW COMPARISON:  None Available. FINDINGS: 5 nonrib bearing lumbar-type vertebral bodies. Vertebral body heights are maintained. No acute fracture.No static listhesis. Mild posterior endplate spurring at L4-L5 and L5-S1. Disc spaces are otherwise maintained. SI joints are unremarkable. IMPRESSION: 1. No  acute fracture or static listhesis of the lumbar spine. 2. Mild posterior endplate spurring at L4-L5 and L5-S1. Electronically Signed   By: Harrietta Sherry M.D.   On: 03/31/2024 15:30     Medications Ordered in the ED - No data to display                                Medical Decision Making  This patient presents to the ED for concern of chronic lower back pain, this involves an extensive number of treatment options, and is a complaint that carries with it a high risk of complications and morbidity.  The differential diagnosis includes malignant lower back mass, lumbar spine degeneration, lipoma, musculoskeletal spasm.  His pain has been present for 6 years now and his mass was previously imaged with an ultrasound.  The ultrasound showed features that were moderately concerning and suggested a follow-up MRI which he never obtained.  On physical exam he does have palpable masses bilaterally that are mobile and potentially slightly larger than they had been previously.  They are painful to him while he is driving his truck or running.  He has not followed up with a PCP in several years.  He previously saw the internal medicine clinic and we are able to get him a appointment for tomorrow.  We would request that his PCP order a lumbar MRI and to further image the masses of his back.  He was hemodynamically stable and his labs did not reveal any systemic causes for his back pain.  Lumbar spine x-ray demonstrated degenerative changes.  He was advised to follow-up with a PCP for further evaluation.   Co morbidities that complicate the patient evaluation  None   Additional history obtained:  External records from outside source obtained and reviewed including previous admission from 10/27-10/29 for malaria. Reviewed office note from 05/26/2019 at which time ultrasound was ordered for his lower back mass.   Lab Tests:  I Ordered, and personally interpreted labs.  The pertinent results include:   CBC and CMP within normal limits   Imaging Studies ordered:  I ordered imaging studies including lumbar spine x-rays  I independently visualized and interpreted imaging which showed endplate degenerative changes of lumbar spine I agree with the radiologist interpretation   EKG:  EKG showed no change since previous EKG, Normal sinus rhythm.    Social Determinants of Health:  No PCP and does not receive regular medical care.   Test / Admission - Considered:  The patient is an infrequent user of medical care.  He has this chronic lower back mass which has been present for several years.  Debated whether we should order an MRI here versus having him follow-up with a  PCP and having them order it.  However, we were able to schedule an appointment with an outpatient physician tomorrow and will allow them to order the MRI not emergently.  Hypertensive but hemodynamically stable.  Safe for discharge.  Final diagnoses:  None    ED Discharge Orders     None          Napoleon Limes, MD 04/01/24 1216    Doretha Folks, MD 04/01/24 1324

## 2024-04-02 ENCOUNTER — Ambulatory Visit: Admitting: Student

## 2024-04-02 VITALS — BP 157/95 | HR 66 | Temp 98.3°F | Ht 67.0 in | Wt 199.0 lb

## 2024-04-02 DIAGNOSIS — R222 Localized swelling, mass and lump, trunk: Secondary | ICD-10-CM | POA: Diagnosis present

## 2024-04-02 NOTE — Progress Notes (Unsigned)
   Established Patient Office Visit  Subjective   Patient ID: Mason Dunn, male    DOB: 1971/05/15  Age: 53 y.o. MRN: [id number]  No chief complaint on file.   Mason Dunn is a 53 y.o. who presents to the clinic for ***. Please see problem based assessment and plan for additional details.   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 While this lesion could reflect a simple lipoma, this has somewhat indeterminate features by ultrasound. Further evaluation with dedicated MRI, with and without contrast, recommended for further characterization.  Mild posterior endplate spurring at L4-L5 and L5-S1   Needs MRI?   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 BP Readings from Last 3 Encounters:  04/02/24 (!) 157/95  04/01/24 (!) 156/98  05/26/19 117/76   Wt Readings from Last 3 Encounters:  04/02/24 199 lb (90.3 kg)  03/31/24 190 lb (86.2 kg)  05/26/19 187 lb (84.8 kg)      Physical Exam   No results found for any visits on 04/02/24.  Last metabolic panel Lab Results  Component Value Date   GLUCOSE 108 (H) 03/31/2024   NA 138 03/31/2024   K 3.9 03/31/2024   CL 105 03/31/2024   CO2 23 03/31/2024   BUN 6 03/31/2024   CREATININE 0.74 03/31/2024   GFRNONAA >60 03/31/2024   CALCIUM 9.3 03/31/2024   PROT 7.5 03/31/2024   ALBUMIN 3.6  03/31/2024   BILITOT <0.2 03/31/2024   ALKPHOS 73 03/31/2024   AST 18 03/31/2024   ALT 29 03/31/2024   ANIONGAP 10 03/31/2024   Last lipids No results found for: CHOL, HDL, LDLCALC, LDLDIRECT, TRIG, CHOLHDL Last hemoglobin A1c No results found for: HGBA1C    The ASCVD Risk score (Arnett DK, et al., 2019) failed to calculate for the following reasons:   Cannot find a previous HDL lab   Cannot find a previous total cholesterol lab    Assessment & Plan:   Problem List Items Addressed This Visit   None   No follow-ups on file.    Damien Lease, DO

## 2024-04-02 NOTE — Patient Instructions (Addendum)
 Thank you, [name] for allowing us  to provide your care today. Today we discussed back pain and your recent emergency department visit.  I have ordered an MRI to look closer at those masses on you lower back.    Follow up: One to two months to establish care     Should you have any questions or concerns please call the internal medicine clinic at [phone].     Please note that our late policy has changed.  If you are more than 15 minutes late to your appointment, you may be asked to reschedule your appointment.  Dr. Kandis, D.O. [facility]    ????? ??? ????? ??? ???????? ??? ????? ?????? ??? ?????? ??????? ?? ?????. ?????? ????? ???? ????? ??????? ??????? ???? ???????. ????? ????? ????? ??????? ?????????? ???? ??? ????? ?? ???? ???? ????.  ????????: ??? ??? ????? ?????? ???????.  ????????? ?? ?????????? ????? ??????? ?????? ???? ??????? ??? ????? [phone].  ???? ????? ?? ????? ??????? ????? ?? ?????. ??? ?????? ?? ????? ????? ?? 15 ?????? ??? ????? ??? ????? ????? ?????.  ?. ????? ???? ????? ?????? ???? ??? ???? ???? ???????

## 2024-04-03 NOTE — Assessment & Plan Note (Addendum)
 Patient has a history of 6 years of lumbar pain associated with bilateral paraspinal soft tissue masses.  He reports that his back pain started when the soft tissue masses started as well.  In the past, he was seen in our clinic and ultrasound was obtained in 2020 which showed 2.1 x 1.2 x 1.5 cm solid lesion involving the soft tissues of the left lower back, corresponding with palpable abnormality of concern. While this lesion could reflect a simple lipoma, this has somewhat indeterminate features by ultrasound.  At that time, the patient did not have insurance and was unable to forward the recommended follow-up MRI.  Patient was seen in the emergency department on 9/16 for progressive lumbar back pain associated with the bilateral paraspinal soft tissue masses.  In the emergency department, a lumbar x-ray was obtained which showed mild posterior endplate spurring at L4-L5 and L5-S1.  At today's appointment, he denied weight loss, fever, chills, bowel incontinence, bladder incontinence, bilateral leg weakness, anesthesia to the lower extremities or any saddle anesthesia.  He does believe that the soft tissue masses have grown and the left-sided soft tissue mass is more painful than the right.  Exam reveals tenderness to palpation of the left> right soft tissue mass.  Range of motion intact including flexion, extension, sidebending, rotation of the spine.  Both patellar and Achilles reflexes are 2/4.  Bilateral upper and lower extremity motor strength of 5 out of 5.  Plan: With the patient's abnormal ultrasound showing indeterminate features of the soft tissue mass in the left lower side, will obtain a MRI with and without contrast of the lumbar spine.

## 2024-04-11 NOTE — Progress Notes (Signed)
 Internal Medicine Clinic Attending  Case discussed with the resident at the time of the visit.  We reviewed the resident's history and exam and pertinent patient test results.  I agree with the assessment, diagnosis, and plan of care documented in the resident's note.

## 2024-05-01 ENCOUNTER — Ambulatory Visit (HOSPITAL_COMMUNITY): Admission: RE | Admit: 2024-05-01 | Source: Ambulatory Visit

## 2024-05-04 NOTE — Progress Notes (Incomplete)
   Patient name: Mason Dunn Date of birth: [birth year] Date of visit: 05/04/24  Type of visit: {imcvsttype:33115}  Subjective   Chief concern: No chief complaint on file.   Mason Dunn is a 53 y.o. male with a PMHx of subcutaneous back mass who presents to [facility] clinic to establish with clinic.   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  ROS  Current Outpatient Medications  Medication Instructions   artemether -lumefantrine  (COARTEM ) 20-120 MG TABS tablet 4 tablets, Oral, Every evening   doxylamine (Sleep) (UNISOM) 25 mg, Oral, At bedtime PRN   ibuprofen  (ADVIL ) 400 mg, Oral, 3 times daily   Multiple Vitamin (MULTIVITAMIN WITH MINERALS) TABS tablet 1 tablet, Oral, Daily    Social History   Tobacco Use   Smoking status: Former    Types: Cigarettes   Smokeless tobacco: Never  Vaping Use   Vaping status: Never Used  Substance Use Topics   Alcohol use: Never   Drug use: Never      Objective  There were no vitals filed for this visit.There is no height or weight on file to calculate BMI.   Physical Exam: Constitutional: well-appearing, well-nourished; *** weight; no acute distress HENT: normocephalic atraumatic, mucous membranes moist Eyes: conjunctiva non-erythematous Cardiovascular: regular rate and rhythm, no m/r/g Pulmonary/Chest: normal work of breathing on room air, lungs CTAB Abdominal: soft, non-tender, non-distended MSK: normal bulk and tone Neurological: alert & oriented x 3, no focal deficit Skin: warm and dry Extremities: BLE without edema or erythema. Psych: normal mood and behavior  {Labs (Optional):23779}    Assessment & Plan  There are no diagnoses linked to this encounter.  No follow-ups on file.  Patient discussed with Dr. {imcattendings:33109}{NAMES:[id number]::, who also saw and evaluated the  patient.,.}  Letha Cheadle, MD [facility]  PGY-1 05/04/2024, 9:35 PM

## 2024-05-05 ENCOUNTER — Ambulatory Visit (INDEPENDENT_AMBULATORY_CARE_PROVIDER_SITE_OTHER)

## 2024-05-05 DIAGNOSIS — Z91199 Patient's noncompliance with other medical treatment and regimen due to unspecified reason: Secondary | ICD-10-CM

## 2024-05-05 DIAGNOSIS — Z131 Encounter for screening for diabetes mellitus: Secondary | ICD-10-CM

## 2024-05-05 DIAGNOSIS — Z13220 Encounter for screening for lipoid disorders: Secondary | ICD-10-CM

## 2024-05-12 NOTE — Progress Notes (Signed)
 Patient no-showed. No charge for encounter. Opened in error.
